# Patient Record
Sex: Male | Born: 1970 | Race: Black or African American | Hispanic: No | Marital: Single | Smoking: Current every day smoker
Health system: Southern US, Community
[De-identification: ages and names within clinical notes are randomized; demographics above are authoritative.]

## PROBLEM LIST (undated history)

## (undated) DIAGNOSIS — N189 Chronic kidney disease, unspecified: Secondary | ICD-10-CM

## (undated) DIAGNOSIS — S21339A Puncture wound without foreign body of unspecified front wall of thorax with penetration into thoracic cavity, initial encounter: Secondary | ICD-10-CM

## (undated) DIAGNOSIS — W3400XA Accidental discharge from unspecified firearms or gun, initial encounter: Secondary | ICD-10-CM

## (undated) HISTORY — PX: ABDOMINAL SURGERY: SHX537

---

## 2002-02-18 ENCOUNTER — Emergency Department (HOSPITAL_COMMUNITY): Admission: EM | Admit: 2002-02-18 | Discharge: 2002-02-18 | Payer: Self-pay | Admitting: Emergency Medicine

## 2002-02-18 ENCOUNTER — Encounter: Payer: Self-pay | Admitting: Emergency Medicine

## 2003-06-01 ENCOUNTER — Emergency Department (HOSPITAL_COMMUNITY): Admission: EM | Admit: 2003-06-01 | Discharge: 2003-06-01 | Payer: Self-pay | Admitting: Nurse Practitioner

## 2003-06-01 ENCOUNTER — Encounter: Payer: Self-pay | Admitting: Emergency Medicine

## 2003-11-26 ENCOUNTER — Emergency Department (HOSPITAL_COMMUNITY): Admission: EM | Admit: 2003-11-26 | Discharge: 2003-11-26 | Payer: Self-pay | Admitting: Emergency Medicine

## 2003-12-14 ENCOUNTER — Emergency Department (HOSPITAL_COMMUNITY): Admission: EM | Admit: 2003-12-14 | Discharge: 2003-12-14 | Payer: Self-pay | Admitting: Emergency Medicine

## 2010-12-17 ENCOUNTER — Inpatient Hospital Stay (INDEPENDENT_AMBULATORY_CARE_PROVIDER_SITE_OTHER)
Admission: RE | Admit: 2010-12-17 | Discharge: 2010-12-17 | Disposition: A | Discharge status: Home / Self Care | Payer: Self-pay | Source: Ambulatory Visit

## 2010-12-17 DIAGNOSIS — R109 Unspecified abdominal pain: Secondary | ICD-10-CM

## 2011-11-21 ENCOUNTER — Encounter (HOSPITAL_BASED_OUTPATIENT_CLINIC_OR_DEPARTMENT_OTHER): Payer: Self-pay | Admitting: *Deleted

## 2011-11-21 ENCOUNTER — Emergency Department (INDEPENDENT_AMBULATORY_CARE_PROVIDER_SITE_OTHER): Payer: Self-pay

## 2011-11-21 ENCOUNTER — Emergency Department (HOSPITAL_BASED_OUTPATIENT_CLINIC_OR_DEPARTMENT_OTHER)
Admission: EM | Admit: 2011-11-21 | Discharge: 2011-11-21 | Disposition: A | Discharge status: Home / Self Care | Payer: Self-pay | Attending: Emergency Medicine | Admitting: Emergency Medicine

## 2011-11-21 DIAGNOSIS — R319 Hematuria, unspecified: Secondary | ICD-10-CM

## 2011-11-21 DIAGNOSIS — N200 Calculus of kidney: Secondary | ICD-10-CM | POA: Insufficient documentation

## 2011-11-21 DIAGNOSIS — R109 Unspecified abdominal pain: Secondary | ICD-10-CM

## 2011-11-21 DIAGNOSIS — L0201 Cutaneous abscess of face: Secondary | ICD-10-CM | POA: Insufficient documentation

## 2011-11-21 DIAGNOSIS — N23 Unspecified renal colic: Secondary | ICD-10-CM

## 2011-11-21 DIAGNOSIS — L03211 Cellulitis of face: Secondary | ICD-10-CM | POA: Insufficient documentation

## 2011-11-21 HISTORY — DX: Accidental discharge from unspecified firearms or gun, initial encounter: W34.00XA

## 2011-11-21 HISTORY — DX: Puncture wound without foreign body of unspecified front wall of thorax with penetration into thoracic cavity, initial encounter: S21.339A

## 2011-11-21 LAB — URINALYSIS, ROUTINE W REFLEX MICROSCOPIC
Nitrite: NEGATIVE
Protein, ur: 30 mg/dL — AB
Specific Gravity, Urine: 1.027 (ref 1.005–1.030)
Urobilinogen, UA: 1 mg/dL (ref 0.0–1.0)

## 2011-11-21 LAB — URINE MICROSCOPIC-ADD ON

## 2011-11-21 LAB — URINE CULTURE

## 2011-11-21 LAB — CULTURE, ROUTINE-ABSCESS

## 2011-11-21 MED ORDER — NAPROXEN 250 MG PO TABS
500.0000 mg | ORAL_TABLET | Freq: Once | ORAL | Status: AC
  Administered 2011-11-21: 500 mg via ORAL
  Filled 2011-11-21: qty 2

## 2011-11-21 MED ORDER — DOXYCYCLINE HYCLATE 100 MG PO TABS
100.0000 mg | ORAL_TABLET | Freq: Once | ORAL | Status: AC
  Administered 2011-11-21: 100 mg via ORAL
  Filled 2011-11-21: qty 1

## 2011-11-21 MED ORDER — LIDOCAINE-EPINEPHRINE 2 %-1:100000 IJ SOLN
INTRAMUSCULAR | Status: AC
  Filled 2011-11-21: qty 1

## 2011-11-21 MED ORDER — NAPROXEN SODIUM 550 MG PO TABS: ORAL_TABLET | ORAL | Status: DC

## 2011-11-21 MED ORDER — HYDROMORPHONE HCL 4 MG PO TABS: ORAL_TABLET | ORAL | Status: DC

## 2011-11-21 MED ORDER — DOXYCYCLINE HYCLATE 100 MG PO CAPS: 100.0000 mg | ORAL_CAPSULE | Freq: Two times a day (BID) | ORAL | Status: AC

## 2011-11-21 MED ORDER — LIDOCAINE-EPINEPHRINE 2 %-1:100000 IJ SOLN: 20.0000 mL | Freq: Once | INTRAMUSCULAR | Status: DC
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
## 2011-11-21 NOTE — ED Notes (Signed)
Pt reports generalized  right sided  abd pain x one year states that pain worsened to the point he was almost unable to stand yesterday. Denies N/V states that he" sure did feel like he was about to pass out yesterday while in the shower" pt also with an abscess to left face.

## 2011-11-23 ENCOUNTER — Emergency Department (HOSPITAL_BASED_OUTPATIENT_CLINIC_OR_DEPARTMENT_OTHER)
Admission: EM | Admit: 2011-11-23 | Discharge: 2011-11-23 | Disposition: A | Discharge status: Home / Self Care | Payer: Self-pay | Attending: Emergency Medicine | Admitting: Emergency Medicine

## 2011-11-23 ENCOUNTER — Encounter (HOSPITAL_BASED_OUTPATIENT_CLINIC_OR_DEPARTMENT_OTHER): Payer: Self-pay | Admitting: *Deleted

## 2011-11-23 DIAGNOSIS — F17200 Nicotine dependence, unspecified, uncomplicated: Secondary | ICD-10-CM | POA: Insufficient documentation

## 2011-11-23 DIAGNOSIS — Z4800 Encounter for change or removal of nonsurgical wound dressing: Secondary | ICD-10-CM | POA: Insufficient documentation

## 2011-11-23 NOTE — ED Notes (Signed)
Pt seen here on Monday had abcess on left lower jaw drained and packed was instructed to return here today for a recheck. Pt states hasnt been having any problems has been taking his meds
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
## 2011-11-24 NOTE — ED Notes (Addendum)
Chart sent to EDP office; I/D done; pt treated w/doxycycline; no sensitivities listed; pt returned on 1/23 for packing removal

## 2011-11-25 NOTE — ED Notes (Signed)
No antibiotic treatment needed at this time. Patient to return for worsening symptoms. Reviewed by Trixie Dredge.

## 2011-11-25 NOTE — ED Notes (Signed)
No further antibiotics treatment needed at this time per  [facility].

## 2011-11-26 LAB — WOUND CULTURE: Culture: NO GROWTH

## 2012-05-11 ENCOUNTER — Other Ambulatory Visit (HOSPITAL_COMMUNITY): Payer: Self-pay | Admitting: Internal Medicine

## 2012-05-11 DIAGNOSIS — R109 Unspecified abdominal pain: Secondary | ICD-10-CM

## 2012-05-15 ENCOUNTER — Ambulatory Visit (HOSPITAL_COMMUNITY)
Admission: RE | Admit: 2012-05-15 | Discharge: 2012-05-15 | Disposition: A | Discharge status: Home / Self Care | Payer: Self-pay | Source: Ambulatory Visit | Attending: Internal Medicine | Admitting: Internal Medicine

## 2012-05-15 DIAGNOSIS — R109 Unspecified abdominal pain: Secondary | ICD-10-CM | POA: Insufficient documentation

## 2012-06-21 ENCOUNTER — Other Ambulatory Visit: Payer: Self-pay | Admitting: Urology

## 2012-06-21 DIAGNOSIS — N200 Calculus of kidney: Secondary | ICD-10-CM

## 2012-07-13 ENCOUNTER — Encounter (HOSPITAL_COMMUNITY): Payer: Self-pay | Admitting: Pharmacy Technician

## 2012-07-16 NOTE — Patient Instructions (Signed)
20 Kery L Cedar  07/16/2012   Your procedure is scheduled on:  07/23/12 1130-200pm  Report to [facility] at 0900 AM.  Call this number if you have problems the morning of surgery: [phone]   Remember:   Do not eat food:After Midnight.  May have clear liquids:until Midnight .    Take these medicines the morning of surgery with A SIP OF WATER:    Do not wear jewelry,   Do not wear lotions, powders, or perfumes.   . Men may shave face and neck.  Do not bring valuables to the hospital.  Contacts, dentures or bridgework may not be worn into surgery.  Leave suitcase in the car. After surgery it may be brought to your room.  For patients admitted to the hospital, checkout time is 11:00 AM the day of discharge.   Special Instructions: CHG Shower Use Special Wash: 1/2 bottle night before surgery and 1/2 bottle morning of surgery. shower chin to toes with CHg.  Wash face and private parts with regular soapl    Please read over the following fact sheets that you were given: MRSA Information, coughing and deep breathing exercises, leg exercises

## 2012-07-17 ENCOUNTER — Encounter (HOSPITAL_COMMUNITY): Payer: Self-pay

## 2012-07-17 ENCOUNTER — Ambulatory Visit (HOSPITAL_COMMUNITY)
Admission: RE | Admit: 2012-07-17 | Discharge: 2012-07-17 | Disposition: A | Discharge status: Home / Self Care | Payer: Self-pay | Source: Ambulatory Visit | Attending: Urology | Admitting: Urology

## 2012-07-17 ENCOUNTER — Encounter (HOSPITAL_COMMUNITY)
Admission: RE | Admit: 2012-07-17 | Discharge: 2012-07-17 | Disposition: A | Discharge status: Home / Self Care | Payer: Self-pay | Source: Ambulatory Visit | Attending: Urology | Admitting: Urology

## 2012-07-17 ENCOUNTER — Other Ambulatory Visit: Payer: Self-pay | Admitting: Radiology

## 2012-07-17 DIAGNOSIS — Z01812 Encounter for preprocedural laboratory examination: Secondary | ICD-10-CM | POA: Insufficient documentation

## 2012-07-17 DIAGNOSIS — N200 Calculus of kidney: Secondary | ICD-10-CM | POA: Insufficient documentation

## 2012-07-17 HISTORY — DX: Chronic kidney disease, unspecified: N18.9

## 2012-07-17 LAB — CBC
Hemoglobin: 14.6 g/dL (ref 13.0–17.0)
MCH: 29.2 pg (ref 26.0–34.0)
MCV: 86.6 fL (ref 78.0–100.0)
Platelets: 364 10*3/uL (ref 150–400)
RBC: 5 MIL/uL (ref 4.22–5.81)
WBC: 6.7 10*3/uL (ref 4.0–10.5)

## 2012-07-17 LAB — BASIC METABOLIC PANEL
CO2: 26 mEq/L (ref 19–32)
Glucose, Bld: 92 mg/dL (ref 70–99)
Potassium: 3.7 mEq/L (ref 3.5–5.1)
Sodium: 137 mEq/L (ref 135–145)

## 2012-07-17 LAB — SURGICAL PCR SCREEN: MRSA, PCR: NEGATIVE

## 2012-07-17 NOTE — Progress Notes (Signed)
Radiology placed orders in computer after preop appointment.  Patient made aware to report to Radiology at 0715am. Patient voiced understanding.

## 2012-07-18 ENCOUNTER — Other Ambulatory Visit: Payer: Self-pay | Admitting: Radiology
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
## 2012-07-23 ENCOUNTER — Inpatient Hospital Stay (HOSPITAL_COMMUNITY)
Admission: RE | Admit: 2012-07-23 | Discharge: 2012-08-09 | DRG: 659 | Disposition: A | Discharge status: Home / Self Care | Payer: MEDICAID | Source: Ambulatory Visit | Attending: Urology | Admitting: Urology

## 2012-07-23 ENCOUNTER — Encounter (HOSPITAL_COMMUNITY): Payer: Self-pay

## 2012-07-23 ENCOUNTER — Ambulatory Visit (HOSPITAL_COMMUNITY)
Admission: RE | Admit: 2012-07-23 | Discharge: 2012-07-23 | Disposition: A | Discharge status: Home / Self Care | Payer: Self-pay | Source: Ambulatory Visit | Attending: Urology | Admitting: Urology

## 2012-07-23 ENCOUNTER — Encounter (HOSPITAL_COMMUNITY): Payer: Self-pay | Admitting: *Deleted

## 2012-07-23 ENCOUNTER — Ambulatory Visit (HOSPITAL_COMMUNITY): Payer: Self-pay | Admitting: Anesthesiology

## 2012-07-23 ENCOUNTER — Inpatient Hospital Stay (HOSPITAL_COMMUNITY)
Admission: RE | Admit: 2012-07-23 | Discharge: 2012-07-23 | Disposition: A | Discharge status: Home / Self Care | Payer: Self-pay | Source: Ambulatory Visit

## 2012-07-23 ENCOUNTER — Encounter (HOSPITAL_COMMUNITY): Payer: Self-pay | Admitting: Anesthesiology

## 2012-07-23 ENCOUNTER — Encounter (HOSPITAL_COMMUNITY)
Admission: RE | Disposition: A | Discharge status: Home / Self Care | Payer: Self-pay | Source: Ambulatory Visit | Attending: Urology

## 2012-07-23 ENCOUNTER — Ambulatory Visit (HOSPITAL_COMMUNITY): Payer: Self-pay

## 2012-07-23 DIAGNOSIS — K560 Paralytic ileus: Secondary | ICD-10-CM | POA: Diagnosis not present

## 2012-07-23 DIAGNOSIS — N200 Calculus of kidney: Secondary | ICD-10-CM

## 2012-07-23 DIAGNOSIS — D62 Acute posthemorrhagic anemia: Secondary | ICD-10-CM | POA: Diagnosis present

## 2012-07-23 DIAGNOSIS — R6520 Severe sepsis without septic shock: Secondary | ICD-10-CM

## 2012-07-23 DIAGNOSIS — R112 Nausea with vomiting, unspecified: Secondary | ICD-10-CM

## 2012-07-23 DIAGNOSIS — F17200 Nicotine dependence, unspecified, uncomplicated: Secondary | ICD-10-CM | POA: Diagnosis present

## 2012-07-23 DIAGNOSIS — R310 Gross hematuria: Secondary | ICD-10-CM | POA: Diagnosis not present

## 2012-07-23 DIAGNOSIS — N390 Urinary tract infection, site not specified: Secondary | ICD-10-CM | POA: Diagnosis present

## 2012-07-23 DIAGNOSIS — N179 Acute kidney failure, unspecified: Secondary | ICD-10-CM | POA: Diagnosis not present

## 2012-07-23 DIAGNOSIS — A419 Sepsis, unspecified organism: Secondary | ICD-10-CM | POA: Diagnosis present

## 2012-07-23 DIAGNOSIS — J189 Pneumonia, unspecified organism: Secondary | ICD-10-CM | POA: Diagnosis not present

## 2012-07-23 DIAGNOSIS — K567 Ileus, unspecified: Secondary | ICD-10-CM

## 2012-07-23 DIAGNOSIS — R338 Other retention of urine: Secondary | ICD-10-CM | POA: Diagnosis not present

## 2012-07-23 DIAGNOSIS — R000 Tachycardia, unspecified: Secondary | ICD-10-CM | POA: Diagnosis present

## 2012-07-23 DIAGNOSIS — J90 Pleural effusion, not elsewhere classified: Secondary | ICD-10-CM | POA: Diagnosis not present

## 2012-07-23 HISTORY — PX: NEPHROLITHOTOMY: SHX5134

## 2012-07-23 LAB — APTT: aPTT: 37 s (ref 24–37)

## 2012-07-23 LAB — PROTIME-INR: Prothrombin Time: 13.3 seconds (ref 11.6–15.2)

## 2012-07-23 SURGERY — NEPHROLITHOTOMY PERCUTANEOUS
Anesthesia: General | Laterality: Right | Wound class: Clean Contaminated

## 2012-07-23 MED ORDER — IOHEXOL 300 MG/ML  SOLN
INTRAMUSCULAR | Status: DC | PRN
  Administered 2012-07-23: 25 mL

## 2012-07-23 MED ORDER — KCL IN DEXTROSE-NACL 20-5-0.45 MEQ/L-%-% IV SOLN
INTRAVENOUS | Status: DC
  Administered 2012-07-23: 100 mL/h via INTRAVENOUS
  Administered 2012-07-24 – 2012-07-25 (×3): via INTRAVENOUS
  Administered 2012-07-25: 150 mL/h via INTRAVENOUS
  Filled 2012-07-23 (×5): qty 1000

## 2012-07-23 MED ORDER — CIPROFLOXACIN IN D5W 400 MG/200ML IV SOLN
400.0000 mg | Freq: Two times a day (BID) | INTRAVENOUS | Status: DC
  Administered 2012-07-23 – 2012-07-28 (×11): 400 mg via INTRAVENOUS
  Filled 2012-07-23 (×15): qty 200

## 2012-07-23 MED ORDER — IOHEXOL 300 MG/ML  SOLN
INTRAMUSCULAR | Status: AC
  Filled 2012-07-23: qty 2

## 2012-07-23 MED ORDER — LIDOCAINE HCL (CARDIAC) 20 MG/ML IV SOLN
INTRAVENOUS | Status: DC | PRN
  Administered 2012-07-23: 75 mg via INTRAVENOUS

## 2012-07-23 MED ORDER — NEOSTIGMINE METHYLSULFATE 1 MG/ML IJ SOLN
INTRAMUSCULAR | Status: DC | PRN
  Administered 2012-07-23: 4 mg via INTRAVENOUS

## 2012-07-23 MED ORDER — KCL IN DEXTROSE-NACL 20-5-0.45 MEQ/L-%-% IV SOLN
INTRAVENOUS | Status: AC
  Filled 2012-07-23: qty 1000

## 2012-07-23 MED ORDER — IOHEXOL 300 MG/ML  SOLN
50.0000 mL | Freq: Once | INTRAMUSCULAR | Status: AC | PRN
  Administered 2012-07-23: 50 mL

## 2012-07-23 MED ORDER — ONDANSETRON HCL 4 MG/2ML IJ SOLN
INTRAMUSCULAR | Status: DC | PRN
  Administered 2012-07-23: 4 mg via INTRAVENOUS

## 2012-07-23 MED ORDER — PROPOFOL 10 MG/ML IV BOLUS
INTRAVENOUS | Status: DC | PRN
  Administered 2012-07-23: 200 mg via INTRAVENOUS

## 2012-07-23 MED ORDER — MIDAZOLAM HCL 5 MG/5ML IJ SOLN
INTRAMUSCULAR | Status: AC | PRN
  Administered 2012-07-23: 1 mg via INTRAVENOUS
  Administered 2012-07-23: 2 mg via INTRAVENOUS

## 2012-07-23 MED ORDER — ONDANSETRON HCL 4 MG/2ML IJ SOLN
4.0000 mg | INTRAMUSCULAR | Status: DC | PRN
  Administered 2012-07-24 – 2012-07-25 (×4): 4 mg via INTRAVENOUS
  Filled 2012-07-23 (×4): qty 2

## 2012-07-23 MED ORDER — HYDROCODONE-ACETAMINOPHEN 5-325 MG PO TABS
1.0000 | ORAL_TABLET | ORAL | Status: DC | PRN
  Administered 2012-07-23 – 2012-08-09 (×43): 2 via ORAL
  Filled 2012-07-23 (×27): qty 2
  Filled 2012-07-23: qty 1
  Filled 2012-07-23 (×18): qty 2

## 2012-07-23 MED ORDER — LACTATED RINGERS IV SOLN
INTRAVENOUS | Status: DC | PRN
  Administered 2012-07-23 (×2): via INTRAVENOUS

## 2012-07-23 MED ORDER — HYDROMORPHONE HCL PF 1 MG/ML IJ SOLN: 0.2500 mg | INTRAMUSCULAR | Status: DC | PRN

## 2012-07-23 MED ORDER — PNEUMOCOCCAL VAC POLYVALENT 25 MCG/0.5ML IJ INJ
0.5000 mL | INJECTION | INTRAMUSCULAR | Status: AC
  Administered 2012-07-24: 0.5 mL via INTRAMUSCULAR
  Filled 2012-07-23: qty 0.5

## 2012-07-23 MED ORDER — PROMETHAZINE HCL 25 MG/ML IJ SOLN: 6.2500 mg | INTRAMUSCULAR | Status: DC | PRN

## 2012-07-23 MED ORDER — INFLUENZA VIRUS VACC SPLIT PF IM SUSP
0.5000 mL | INTRAMUSCULAR | Status: AC
  Administered 2012-07-24: 0.5 mL via INTRAMUSCULAR
  Filled 2012-07-23: qty 0.5

## 2012-07-23 MED ORDER — ROCURONIUM BROMIDE 100 MG/10ML IV SOLN
INTRAVENOUS | Status: DC | PRN
  Administered 2012-07-23: 40 mg via INTRAVENOUS

## 2012-07-23 MED ORDER — HYDROMORPHONE HCL PF 1 MG/ML IJ SOLN
INTRAMUSCULAR | Status: DC | PRN
  Administered 2012-07-23 (×4): 0.5 mg via INTRAVENOUS

## 2012-07-23 MED ORDER — FENTANYL CITRATE 0.05 MG/ML IJ SOLN
INTRAMUSCULAR | Status: AC
  Filled 2012-07-23: qty 6

## 2012-07-23 MED ORDER — FENTANYL CITRATE 0.05 MG/ML IJ SOLN
INTRAMUSCULAR | Status: DC | PRN
  Administered 2012-07-23: 50 ug via INTRAVENOUS
  Administered 2012-07-23 (×2): 100 ug via INTRAVENOUS

## 2012-07-23 MED ORDER — MIDAZOLAM HCL 2 MG/2ML IJ SOLN
INTRAMUSCULAR | Status: AC
  Filled 2012-07-23: qty 6

## 2012-07-23 MED ORDER — CIPROFLOXACIN IN D5W 400 MG/200ML IV SOLN
400.0000 mg | INTRAVENOUS | Status: AC
  Administered 2012-07-23: 400 mg via INTRAVENOUS
  Filled 2012-07-23: qty 200

## 2012-07-23 MED ORDER — FENTANYL CITRATE 0.05 MG/ML IJ SOLN
INTRAMUSCULAR | Status: AC | PRN
  Administered 2012-07-23: 50 ug via INTRAVENOUS
  Administered 2012-07-23 (×2): 100 ug via INTRAVENOUS

## 2012-07-23 MED ORDER — LACTATED RINGERS IV SOLN: INTRAVENOUS | Status: DC

## 2012-07-23 MED ORDER — GLYCOPYRROLATE 0.2 MG/ML IJ SOLN
INTRAMUSCULAR | Status: DC | PRN
  Administered 2012-07-23: 0.6 mg via INTRAVENOUS

## 2012-07-23 MED ORDER — ESMOLOL HCL 10 MG/ML IV SOLN
INTRAVENOUS | Status: DC | PRN
  Administered 2012-07-23 (×3): 20 mg via INTRAVENOUS

## 2012-07-23 MED ORDER — CIPROFLOXACIN IN D5W 400 MG/200ML IV SOLN: 400.0000 mg | INTRAVENOUS | Status: DC

## 2012-07-23 MED ORDER — HYDROMORPHONE HCL PF 1 MG/ML IJ SOLN
0.5000 mg | INTRAMUSCULAR | Status: DC | PRN
  Administered 2012-07-23 – 2012-07-28 (×27): 1 mg via INTRAVENOUS
  Administered 2012-07-28: 0.5 mg via INTRAVENOUS
  Administered 2012-07-28 – 2012-08-06 (×55): 1 mg via INTRAVENOUS
  Filled 2012-07-23 (×85): qty 1

## 2012-07-23 MED ORDER — SODIUM CHLORIDE 0.9 % IR SOLN
Status: DC | PRN
  Administered 2012-07-23: 9000 mL

## 2012-07-23 MED ORDER — SODIUM CHLORIDE 0.9 % IV SOLN
INTRAVENOUS | Status: DC
  Administered 2012-07-23 (×2): via INTRAVENOUS
  Administered 2012-07-24: 10 mL/h via INTRAVENOUS

## 2012-07-23 SURGICAL SUPPLY — 44 items
BAG URINE DRAINAGE (UROLOGICAL SUPPLIES) ×2 IMPLANT
BASKET ZERO TIP NITINOL 2.4FR (BASKET) IMPLANT
BENZOIN TINCTURE PRP APPL 2/3 (GAUZE/BANDAGES/DRESSINGS) ×4 IMPLANT
CATH FOLEY 2W COUNCIL 20FR 5CC (CATHETERS) IMPLANT
CATH FOLEY 2W COUNCIL 5CC 18FR (CATHETERS) ×2 IMPLANT
CATH FOLEY 2WAY SLVR  5CC 18FR (CATHETERS) ×1
CATH FOLEY 2WAY SLVR 5CC 18FR (CATHETERS) ×1 IMPLANT
CATH ROBINSON RED A/P 20FR (CATHETERS) IMPLANT
CATH X-FORCE N30 NEPHROSTOMY (TUBING) ×2 IMPLANT
CLOTH BEACON ORANGE TIMEOUT ST (SAFETY) ×2 IMPLANT
COVER SURGICAL LIGHT HANDLE (MISCELLANEOUS) ×2 IMPLANT
DRAPE C-ARM 42X72 X-RAY (DRAPES) ×2 IMPLANT
DRAPE CAMERA CLOSED 9X96 (DRAPES) ×2 IMPLANT
DRAPE LINGEMAN PERC (DRAPES) ×2 IMPLANT
DRAPE SURG IRRIG POUCH 19X23 (DRAPES) ×2 IMPLANT
DRSG TEGADERM 8X12 (GAUZE/BANDAGES/DRESSINGS) ×4 IMPLANT
GLOVE BIOGEL M STRL SZ7.5 (GLOVE) ×2 IMPLANT
GOWN STRL REIN XL XLG (GOWN DISPOSABLE) ×4 IMPLANT
GUIDEWIRE ANG ZIPWIRE 038X150 (WIRE) ×2 IMPLANT
KIT BASIN OR (CUSTOM PROCEDURE TRAY) ×2 IMPLANT
LASER FIBER DISP (UROLOGICAL SUPPLIES) IMPLANT
LASER FIBER DISP 1000U (UROLOGICAL SUPPLIES) IMPLANT
MANIFOLD NEPTUNE II (INSTRUMENTS) ×2 IMPLANT
NS IRRIG 1000ML POUR BTL (IV SOLUTION) IMPLANT
PACK BASIC VI WITH GOWN DISP (CUSTOM PROCEDURE TRAY) ×2 IMPLANT
PAD ABD 7.5X8 STRL (GAUZE/BANDAGES/DRESSINGS) ×4 IMPLANT
POSITIONER SURGICAL ARM (MISCELLANEOUS) ×4 IMPLANT
PROBE EHL 9 FR 470CM (MISCELLANEOUS) IMPLANT
PROBE LITHOCLAST ULTRA 3.8X403 (UROLOGICAL SUPPLIES) ×2 IMPLANT
PROBE PNEUMATIC 1.0MMX570MM (UROLOGICAL SUPPLIES) ×2 IMPLANT
SET IRRIG Y TYPE TUR BLADDER L (SET/KITS/TRAYS/PACK) ×2 IMPLANT
SET WARMING FLUID IRRIGATION (MISCELLANEOUS) ×2 IMPLANT
SPONGE GAUZE 4X4 12PLY (GAUZE/BANDAGES/DRESSINGS) ×2 IMPLANT
SPONGE LAP 4X18 X RAY DECT (DISPOSABLE) ×2 IMPLANT
STONE CATCHER W/TUBE ADAPTER (UROLOGICAL SUPPLIES) ×2 IMPLANT
SUT SILK 2 0 30  PSL (SUTURE) ×1
SUT SILK 2 0 30 PSL (SUTURE) ×1 IMPLANT
SYR 20CC LL (SYRINGE) ×4 IMPLANT
SYRINGE 10CC LL (SYRINGE) ×2 IMPLANT
TOWEL OR 17X26 10 PK STRL BLUE (TOWEL DISPOSABLE) ×2 IMPLANT
TOWEL OR NON WOVEN STRL DISP B (DISPOSABLE) ×2 IMPLANT
TRAY FOLEY CATH 14FRSI W/METER (CATHETERS) ×2 IMPLANT
TUBING CONNECTING 10 (TUBING) ×6 IMPLANT
WATER STERILE IRR 1500ML POUR (IV SOLUTION) IMPLANT

## 2012-07-23 NOTE — Interval H&P Note (Signed)
History and Physical Interval Note:  07/23/2012 11:25 AM  Jared Mendez  has presented today for surgery, with the diagnosis of Right Renal Calculus  The various methods of treatment have been discussed with the patient and family. After consideration of risks, benefits and other options for treatment, the patient has consented to  Procedure(s) (LRB) with comments: NEPHROLITHOTOMY PERCUTANEOUS (Right) -   as a surgical intervention .  The patient's history has been reviewed, patient examined, no change in status, stable for surgery.  I have reviewed the patient's chart and labs.  Questions were answered to the patient's satisfaction.     [name]

## 2012-07-23 NOTE — Transfer of Care (Signed)
Immediate Anesthesia Transfer of Care Note  Patient: Jared Mendez  Procedure(s) Performed: Procedure(s) (LRB) with comments: NEPHROLITHOTOMY PERCUTANEOUS (Right) -    Patient Location: PACU  Anesthesia Type: General  Level of Consciousness: awake, alert  and patient cooperative  Airway & Oxygen Therapy: Patient Spontanous Breathing and Patient connected to face mask oxygen  Post-op Assessment: Report given to PACU RN, Post -op Vital signs reviewed and stable and Patient moving all extremities X 4  Post vital signs: Reviewed and stable  Complications: No apparent anesthesia complications

## 2012-07-23 NOTE — Anesthesia Postprocedure Evaluation (Signed)
Anesthesia Post Note  Patient: Jared Mendez  Procedure(s) Performed: Procedure(s) (LRB): NEPHROLITHOTOMY PERCUTANEOUS (Right)  Anesthesia type: General  Patient location: PACU  Post pain: Pain level controlled  Post assessment: Post-op Vital signs reviewed  Last Vitals:  Filed Vitals:   07/23/12 1414  Temp: 36 C  Resp: 12    Post vital signs: Reviewed  Level of consciousness: sedated  Complications: No apparent anesthesia complications

## 2012-07-23 NOTE — Procedures (Signed)
Successful placement of right sided 5 Fr catheter for PNL access.  Tip of catheter is within the urinary bladder.  No immediate complications.    

## 2012-07-23 NOTE — Anesthesia Preprocedure Evaluation (Addendum)
Anesthesia Evaluation  Patient identified by MRN, date of birth, ID band Patient awake    Reviewed: Allergy & Precautions, H&P , NPO status , Patient's Chart, lab work & pertinent test results  Airway Mallampati: II TM Distance: >3 FB Neck ROM: Full    Dental  (+) Teeth Intact and Dental Advisory Given   Pulmonary neg pulmonary ROS, Current Smoker,  breath sounds clear to auscultation  Pulmonary exam normal       Cardiovascular negative cardio ROS  Rhythm:Regular Rate:Normal     Neuro/Psych negative neurological ROS  negative psych ROS   GI/Hepatic negative GI ROS, Neg liver ROS,   Endo/Other  negative endocrine ROS  Renal/GU Renal diseasenegative Renal ROS  negative genitourinary   Musculoskeletal negative musculoskeletal ROS (+)   Abdominal   Peds  Hematology negative hematology ROS (+)   Anesthesia Other Findings   Reproductive/Obstetrics negative OB ROS                           Anesthesia Physical Anesthesia Plan  ASA: II  Anesthesia Plan: General   Post-op Pain Management:    Induction: Intravenous  Airway Management Planned: LMA  Additional Equipment:   Intra-op Plan:   Post-operative Plan: Extubation in OR  Informed Consent: I have reviewed the patients History and Physical, chart, labs and discussed the procedure including the risks, benefits and alternatives for the proposed anesthesia with the patient or authorized representative who has indicated his/her understanding and acceptance.   Dental advisory given  Plan Discussed with: CRNA  Anesthesia Plan Comments:         Anesthesia Quick Evaluation
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
## 2012-07-23 NOTE — Op Note (Signed)
Preoperative diagnosis: Right renal calculus Postoperative diagnosis: Same  Procedure: Right percutaneous nephrolithotomy   Surgeon: Valetta Fuller M.D. assisted by Dr. Megan Mans in interventional radiology. Anesthesia: Gen.  Indications: Patient was sent to [place] from the [facility] community network for consideration of treatment of a right renal calculus. He had been complaining of intermittent/chronic right-sided abdominal pain and was noted to have a 1.3 cm stone in his right renal pelvis/hilum. There was minimal hydronephrosis but the thought was that this might be causing his discomfort. We agreed to care for him through the community care network. We talked about several treatment options. I felt the stone was really quite dense and that the best chance for definitive management of the stone was a percutaneous procedure. 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 We did offer to go having care for him if he shows and he wanted [place] to proceed with definitive treatment.     Technique and findings: The patient had placement of a right percutaneous nephrostomy tube by interventional radiology prior to the procedure. At access was quite difficult but successful and I did review the procedure and films with the interventional radiologist. The patient had placement of compression boots and preoperative antibiotics. He had successful induction of general endotracheal anesthesia and placement of Foley catheter. He was then placed in the supine position and prepped and draped in usual manner. The kidney was accessed and a safety wire placed in the bladder. Once 2 wires were in good position of the fashion was dilated with a balloon and access sheath was placed. With the help was fluoroscopy we were able to access the stone which again appeared to be between 1-1/2 cm. The ultrasonic probe was utilized to break up the stone and sucked out majority of  the fragments. 5 or 6 pieces were then basket extracted. No obvious residual fragments were noted. A nephrostomy tube was placed utilizing an 18 [place] council tip Foley catheter with contrast injected. This is secured to the skin. The patient was brought to recovery room in stable condition having had no obvious complications or problems.

## 2012-07-24 ENCOUNTER — Inpatient Hospital Stay (HOSPITAL_COMMUNITY): Payer: Self-pay

## 2012-07-24 ENCOUNTER — Encounter (HOSPITAL_COMMUNITY): Payer: Self-pay | Admitting: Urology

## 2012-07-24 LAB — HEMOGLOBIN AND HEMATOCRIT, BLOOD
HCT: 28.9 % — ABNORMAL LOW (ref 39.0–52.0)
Hemoglobin: 10.1 g/dL — ABNORMAL LOW (ref 13.0–17.0)

## 2012-07-24 LAB — CBC
HCT: 35.5 % — ABNORMAL LOW (ref 39.0–52.0)
MCV: 86.6 fL (ref 78.0–100.0)
RBC: 4.1 MIL/uL — ABNORMAL LOW (ref 4.22–5.81)
WBC: 16.7 10*3/uL — ABNORMAL HIGH (ref 4.0–10.5)

## 2012-07-24 LAB — BASIC METABOLIC PANEL
BUN: 13 mg/dL (ref 6–23)
CO2: 22 mEq/L (ref 19–32)
Chloride: 98 mEq/L (ref 96–112)
Creatinine, Ser: 1.39 mg/dL — ABNORMAL HIGH (ref 0.50–1.35)
Potassium: 3.9 mEq/L (ref 3.5–5.1)

## 2012-07-24 MED ORDER — KETOROLAC TROMETHAMINE 30 MG/ML IJ SOLN
30.0000 mg | Freq: Once | INTRAMUSCULAR | Status: AC
  Administered 2012-07-24: 30 mg via INTRAVENOUS
  Filled 2012-07-24: qty 1

## 2012-07-24 MED ORDER — SODIUM CHLORIDE 0.9 % IV BOLUS (SEPSIS)
1000.0000 mL | Freq: Once | INTRAVENOUS | Status: AC
  Administered 2012-07-24: 1000 mL via INTRAVENOUS

## 2012-07-24 MED ORDER — PANTOPRAZOLE SODIUM 40 MG PO TBEC
40.0000 mg | DELAYED_RELEASE_TABLET | Freq: Every day | ORAL | Status: DC
  Administered 2012-07-24: 40 mg via ORAL
  Filled 2012-07-24 (×3): qty 1

## 2012-07-24 MED ORDER — PROMETHAZINE HCL 25 MG/ML IJ SOLN
25.0000 mg | Freq: Once | INTRAMUSCULAR | Status: AC
  Administered 2012-07-24: 25 mg via INTRAVENOUS
  Filled 2012-07-24: qty 1

## 2012-07-24 NOTE — Progress Notes (Signed)
Chaplain responded to code blue.  Provided emotional support with pt's grandmother during code.    Belva Crome  MDiv

## 2012-07-24 NOTE — Progress Notes (Signed)
Patient continue to C/O N/V, PRN Zofran given, not as effective. Vomiting coffee grand color emesis, @ 7Am before eating breakfast he vomited as well. Dr Isabel Caprice notified, ordered Toradol 30mg  and phenergan 25mg  X1, and also protonix. Patient informed, will continue to assess patient.

## 2012-07-24 NOTE — Progress Notes (Signed)
Noted large amount of blood and clot on patient's bed from the nephrostomy site that was removed at 1255 by Dr. Isabel Caprice, assessed site but no more active bleeding from site, Pressure dressing applied to the site again, patient stated he wants to go to the bathroom to urinate, encouraged patient at least to use the urinal standing at the bedside with assistance because of the blood loss, he stated no he can walk and wants to go the bathroom to urinate. Assisted patient to the bathroom trying to change his gown, patient stated he is dizzy leaning forward, supported the patient with my hands, was unresponsive for about a second and back responding, called for assistance, code blue and assisted patient back in bed, BP106/70, 104, 16, 99%-2Ls N/C, CBG-240, patient responding appropriately and answer question. Dr. Isabel Caprice notified and ordered 1L of NS bolus, H/H. Patient'd grandmother at the bedside and updated with information. rechecked vitals 112/74, 100%-2Ls. In bed resting C/O nausea, Zofran given. Will continue to assess patient.

## 2012-07-24 NOTE — Progress Notes (Signed)
Patient in bed resting and no distress noted, no bleeding noted,  dressing clean/dry/intact. Reported off to night shift nurse.

## 2012-07-24 NOTE — Progress Notes (Signed)
Patient ID: Jared Mendez, male   DOB: 1971-06-18, 41 y.o.   MRN: [id number] 1 Day Post-Op Subjective: Patient moderate pain and nausea. last 24 hours: Temp:  [96.8 F (36 C)-98.5 F (36.9 C)] 98.2 F (36.8 C) (09/24 1051) Pulse Rate:  [85-104] 95  (09/24 1051) Resp:  [12-16] 16  (09/24 1051) BP: (121-147)/(75-97) 136/97 mmHg (09/24 1051) SpO2:  [96 %-100 %] 99 % (09/24 1051) Weight:  [76.204 kg (168 lb)] 76.204 kg (168 lb) (09/23 1512)  Intake/Output from previous day: 09/23 0701 - 09/24 0700 In: 1100 [I.V.:1100] Out: 565 [Urine:555] Intake/Output this shift: Total I/O In: -  Out: 2 [Emesis/NG output:1; Blood:1]  Physical Exam:  Constitutional: Vital signs reviewed. WD WN in NAD   Eyes: PERRL, No scleral icterus.   Cardiovascular: RRR Pulmonary/Chest: Normal effort Abdominal: Soft. Non-tender. NT site ok.  Genitourinary:foley draining well Extremities: No cyanosis or edema   Lab Results:  [facility] 07/24/12 0442  HGB 12.1*  HCT 35.5*   BMET  Basename 07/24/12 0442  NA 131*  K 3.9  CL 98  CO2 22  GLUCOSE 111*  BUN 13  CREATININE 1.39*  CALCIUM 8.4    Basename 07/23/12 0800  LABPT --  INR 1.02   No results found for this basename: LABURIN:1 in the last 72 hours Results for orders placed during the hospital encounter of 07/17/12  SURGICAL PCR SCREEN     Status: Normal   Collection Time   07/17/12  9:45 AM      Component Value Range Status Comment   MRSA, PCR NEGATIVE  NEGATIVE Final    Staphylococcus aureus NEGATIVE  NEGATIVE Final     Studies/Results: Ct Abdomen Pelvis Wo Contrast  07/24/2012  *RADIOLOGY REPORT*  Clinical Data: Right flank pain  CT ABDOMEN AND PELVIS WITHOUT CONTRAST  Technique:  Multidetector CT imaging of the abdomen and pelvis was performed following the standard protocol without intravenous contrast.  Comparison:  07/23/2012 percutaneous nephrostomy, 11/21/2011 CT.  Findings: Small right pleural effusion.  Trace left pleural  effusion.  Associated consolidations.  Moderate hiatal hernia. Normal heart size.  Organ abnormality/lesion detection is limited in the absence of intravenous contrast. Within this limitation, unremarkable liver, spleen, pancreas, left adrenal gland.  Right adrenal gland is difficult to localize.  High attenuation within the gallbladder presumably represents vicarious excretion.  No biliary ductal dilatation.  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  Surgical suture along the stomach and left upper quadrant.  The right kidney is rotated and heterogeneous in attenuation. Percutaneous nephrostomy tube in place.  The tube balloon is positioned within the lower pole cortex.  There is high attenuation within the renal pelvis as well as locules of gas which extend inferiorly along the course of the right ureter. No residual stone fragments identified.  There is a 3.1 x 7.1 cm hematoma within the right posterior pararenal space.  Small amount of blood products extends inferiorly within the right paracolic gutter and anterior to the psoas.  There is a simple appearing perihepatic fluid.  No bowel obstruction.  No CT evidence for colitis.  No free intraperitoneal air.  No lymphadenopathy.  Limited vascular evaluation without intravenous contrast.  No aneurysmal dilatation.  Decompressed bladder with a Foley catheter balloon in place.  No pelvic lymphadenopathy.  L1 vertebral body deformity is post traumatic.  No acute osseous finding.  IMPRESSION: Status post right percutaneous nephrostomy.  The nephrostomy tube tip is within the collecting system however the  balloon appears to be within the cortex.  The right renal parenchyma is heterogeneous in attenuation which may reflect areas of retained contrast. Attention at follow-up.  Hematoma within the right posterior pararenal space as above.  High attenuation within the right collecting system is in keeping with retained contrast or blood  products. No residual stone fragments identified.  Small right pleural effusion with associated consolidation, likely atelectasis.  Discussed via telephone with Dr. Grace Isaac at 08:30 a.m. on 07/24/2012.   Original Report Authenticated By: Waneta Martins, M.D.    Dg Abd 1 View  07/23/2012  *RADIOLOGY REPORT*  Clinical Data:  RIGHT renal calculi.  Status post nephrostomy access.  Dilatation of the nephrostomy tract is performed intraoperatively for nephrolithotomy.  DILATATION OF RIGHT PERCUTANEOUS NEPHROSTOMY TRACT  The nephrostomy catheter was removed over a guidewire in the operating room.  C-arm fluoroscopy was utilized.  A 9-French peel- away sheath was advanced into the ureter.  A second guidewire was then advanced into the bladder.  Percutaneous nephrostomy tract dilatation was performed with a 10 mm balloon.  A 30-French sheath was then advanced to the level of the renal calculus under C-arm fluoroscopy.  This sheath was utilized by Dr. Isabel Caprice during the nephrolithotomy procedure.  IMPRESSION: Intraoperative dilatation of right percutaneous nephrostomy tract for nephrolithotomy.   Original Report Authenticated By: Waynard Reeds, M.D.    Dg C-arm 1-60 Min-no Report  07/23/2012  *RADIOLOGY REPORT*  Clinical Data:  RIGHT renal calculi.  Status post nephrostomy access.  Dilatation of the nephrostomy tract is performed intraoperatively for nephrolithotomy.  DILATATION OF RIGHT PERCUTANEOUS NEPHROSTOMY TRACT  The nephrostomy catheter was removed over a guidewire in the operating room.  C-arm fluoroscopy was utilized.  A 9-French peel- away sheath was advanced into the ureter.  A second guidewire was then advanced into the bladder.  Percutaneous nephrostomy tract dilatation was performed with a 10 mm balloon.  A 30-French sheath was then advanced to the level of the renal calculus under C-arm fluoroscopy.  This sheath was utilized by Dr. Isabel Caprice during the nephrolithotomy procedure.  IMPRESSION:  Intraoperative dilatation of right percutaneous nephrostomy tract for nephrolithotomy.   Original Report Authenticated By: Waynard Reeds, M.D.    [place] Abts Cath Perc Right  07/23/2012  *RADIOLOGY REPORT*  Indication: Renal stones, access for right-sided percutaneous nephrolithotomy.  1.  FLUOROSCOPIC GUIDANCE FOR PUNCTURE OF THE RIGHT RENAL COLLECTING SYSTEM. 2. RIGHT SIDED PERCUTANEOUS NEPHROSTOMY TUBE PLACEMENT  Comparison: CT of the abdomen and pelvis - 11/21/2011; abdominal radiograph - 05/15/2012  Intravenous medications: Fentanyl 300 mcg IV; Versed 3 mg IV; Ciprofloxacin 400 mg IV; The antibiotic was administered in an appropriate time frame prior to skin puncture.  Total Moderate Sedation Time: 45 minutes.  Contrast: 50 mL Omnipaque-300 (administered into the collecting system)  Fluoroscopy Time: 19.7 minutes.  Complications: None immediate  Procedure:  Informed written consent was obtained from the patient after a discussion of the risks, benefits, and alternatives to treatment. The right flank region was prepped with Betadine in a sterile fashion, and a sterile drape was applied covering the operative field.  A sterile gown and sterile gloves were used for the procedure.  A timeout was performed prior to the initiation of the procedure.  A pre procedural spot fluoroscopic image was obtained of the upper abdomen.  Ultrasound scanning performed of the kidney was negative for significant hydronephrosis.  As such, the stone within inferior aspect of the right kidney was targeted fluoroscopically  with a 22 gauge Chiba needle.  Access to the collecting system was confirmed with advancement of a Nitrex wire into the collecting system.  The needle was exchanged for the inner 3 French catheter from an Accustick set and contrast injection confirmed access.   A small amount of air was injected into the collecting system to help delineate a posterior calyx.  A posterior inferior calyx was targeted with a 22  gauge Chiba needle.  Access to the calyx was confirmed with advancement of a Nitrex wire into the collecting system.  An Accustick set was utilized to dilate the tract and was subsequently exchanged for a Kumpe catheter over a Bentson wire. The angled Rim catheter was advanced down the ureter and into the urinary bladder.  Postprocedural spot radiographs were obtained in various obliquities and the catheter was sutured to the skin.  The catheter was capped and a dressing was placed.  The patient tolerated the procedure well without immediate postprocedural complication.  Findings:  Pre procedural spot radiographic images demonstrates grossly unchanged appearance of these approximately 1.0 x 1.1 cm stone overlying the expected location of the inferior pole of the right kidney as demonstrated on recent abdominal CT.  Ultrasound scanning was negative for significant hydronephrosis and as such, the stone was targeted fluoroscopically allowing access to the collecting system.  Post contrast injection of the collecting system demonstrates lateral rotation of the right kidney with the renal pelvis and UPJ positioned laterally as demonstrated on preprocedural abdominal CT. The stone appears in an indeterminate location, possibly within the inferior aspect of a nondilated renal pelvis versus the infundibulum supplying the posterior renal calyces.  Given the malpositioning of the right kidney, a slightly medial approach was utilized to access a now opacified posterior inferior calix via fluoroscopic guidance.  Ultimately a Rim angled catheter was advanced through this calix with tip ultimately terminating within the urinary bladder.  IMPRESSION:  1.  Successful fluoroscopic guided placement of a right sided 5 French angled Rim catheter to the level of the urinary bladder to be utilized during impending nephrolithotomy procedure.  2.  Malpositioned right kidney with renal pelvis directed laterally.  The existing renal stone is  in an indeterminate location, either within the inferior aspect of a nondilated renal pelvis or the infundibulum supplying the inferior calyces.  Above findings discussed and reviewed with Dr. Isabel Caprice at the time of procedure completion.   Original Report Authenticated By: Waynard Reeds, M.D.     Assessment/Plan:   CT imaging shows resolution of the stone and it does appear we obtained all the stone material. He does have a hematoma around his kidney but nothing that is not unexpected. Hemoglobin was reasonable.  I went ahead I discontinued the patient's nephrostomy tube in his Foley will be removed. We'll monitor his hemoglobin and continue to observe him overnight. The patient did receive one dose of Toradol which helped his pain but I want to be careful with that since he does have a hematoma.   LOS: 1 day   Sherrice Creekmore S 07/24/2012, 12:56 PM

## 2012-07-24 NOTE — Care Management Note (Signed)
    Page 1 of 2   08/09/2012     2:24:44 PM   CARE MANAGEMENT NOTE 08/09/2012  Patient:  Jared Mendez, Jared Mendez   Account Number:  [id number]  Date Initiated:  07/24/2012  Documentation initiated by:  Lanier Clam  Subjective/Objective Assessment:   ADMITTED W/RENAL STONE.NEPHROSTOMY.     Action/Plan:   FROM HOME   Anticipated DC Date:  08/09/2012   Anticipated DC Plan:  HOME/SELF CARE  In-house referral  Artist  PCP / [facility]      DC Planning Services  CM consult  Medication Assistance      Choice offered to / List presented to:             Status of service:  Completed, signed off Medicare Important Message given?   (If response is "NO", the following Medicare IM given date fields will be blank) Date Medicare IM given:   Date Additional Medicare IM given:    Discharge Disposition:  HOME/SELF CARE  Per UR Regulation:  Reviewed for med. necessity/level of care/duration of stay  If discussed at Long Length of Stay Meetings, dates discussed:   07/31/2012  08/02/2012  08/07/2012  08/09/2012    Comments:  08/09/12 Roosvelt Churchwell RN,BSN NCM [phone] D/C HOME NO D/C NEEDS.  08/07/12 Abhishek Levesque RN,BSN NCM [phone] 2UPRBC.CONTINUE TO MONITOR PROGRESS.D/C PLAN HOME.IF HHC NEEDED CAN ARRANGE. RECOMMEND HHRN.  08/02/12 Ramiyah Mcclenahan RN,BSN NCM [phone] STILL W/HEMATURIA.IR FOLLOWING FOR ?STENT/EMBOLIZATION.   07/31/12 Kevona Lupinacci RN,BSN NCM [phone] S/P PERCUTANEOUS R KIDNEY REMOVAL,PULM FOLLOWING-PLEURAL EFFUSION,THORACENTESIS.HGB 7.WBC-ELEVATED.HOME WHEN STABLE.  [id number] Earlene Plater, RN, BSN, CCM: CHART REVIEWED AND UPDATED. NO DISCHARGE NEEDS PRESENT AT THIS TIME. CASE MANAGEMENT [phone]   07/25/12 Ludella Pranger RN,BSN NCM [phone] TRANSFERRED TO SDU-TACHY,TEMP,VOMITING DARK BROWN EMESIS.RAPID RESPONSE YESTERDAY-UNRESPONSIVE,BLOOD @ NEPHROSTOMY SITE. QUALIFIES FOR INDIGENT FUNDS IF NEEDED.PROVIDED W/PCP LISTING/[facility]/COMMUNITY  RESOURCES.  07/24/12 Chiquita Heckert RN,BSN NCM [phone]

## 2012-07-25 ENCOUNTER — Inpatient Hospital Stay (HOSPITAL_COMMUNITY): Payer: MEDICAID

## 2012-07-25 DIAGNOSIS — R000 Tachycardia, unspecified: Secondary | ICD-10-CM

## 2012-07-25 DIAGNOSIS — D62 Acute posthemorrhagic anemia: Secondary | ICD-10-CM

## 2012-07-25 DIAGNOSIS — N390 Urinary tract infection, site not specified: Secondary | ICD-10-CM

## 2012-07-25 DIAGNOSIS — A419 Sepsis, unspecified organism: Secondary | ICD-10-CM | POA: Diagnosis present

## 2012-07-25 DIAGNOSIS — R6520 Severe sepsis without septic shock: Secondary | ICD-10-CM

## 2012-07-25 DIAGNOSIS — R112 Nausea with vomiting, unspecified: Secondary | ICD-10-CM

## 2012-07-25 LAB — HEMOGLOBIN AND HEMATOCRIT, BLOOD
HCT: 20 % — ABNORMAL LOW (ref 39.0–52.0)
Hemoglobin: 7.2 g/dL — ABNORMAL LOW (ref 13.0–17.0)

## 2012-07-25 LAB — CBC
HCT: 25.9 % — ABNORMAL LOW (ref 39.0–52.0)
Hemoglobin: 8.9 g/dL — ABNORMAL LOW (ref 13.0–17.0)
MCH: 30.8 pg (ref 26.0–34.0)
MCHC: 34.4 g/dL (ref 30.0–36.0)
MCHC: 36.3 g/dL — ABNORMAL HIGH (ref 30.0–36.0)
Platelets: 193 10*3/uL (ref 150–400)
RBC: 3.01 MIL/uL — ABNORMAL LOW (ref 4.22–5.81)
RDW: 13.2 % (ref 11.5–15.5)

## 2012-07-25 LAB — BASIC METABOLIC PANEL
BUN: 19 mg/dL (ref 6–23)
Calcium: 7.6 mg/dL — ABNORMAL LOW (ref 8.4–10.5)
Chloride: 97 mEq/L (ref 96–112)
GFR calc Af Amer: 51 mL/min — ABNORMAL LOW (ref >90)
GFR calc Af Amer: 50 mL/min — ABNORMAL LOW (ref >90)
GFR calc non Af Amer: 43 mL/min — ABNORMAL LOW (ref >90)
Potassium: 4.1 mEq/L (ref 3.5–5.1)
Sodium: 130 mEq/L — ABNORMAL LOW (ref 135–145)
Sodium: 130 mEq/L — ABNORMAL LOW (ref 135–145)

## 2012-07-25 LAB — PREPARE RBC (CROSSMATCH)

## 2012-07-25 MED ORDER — ACETAMINOPHEN 325 MG PO TABS
650.0000 mg | ORAL_TABLET | ORAL | Status: DC | PRN
  Administered 2012-07-25 – 2012-08-01 (×6): 650 mg via ORAL
  Filled 2012-07-25 (×5): qty 2

## 2012-07-25 MED ORDER — PROMETHAZINE HCL 25 MG/ML IJ SOLN
12.5000 mg | INTRAMUSCULAR | Status: DC | PRN
  Administered 2012-07-25 – 2012-08-09 (×39): 12.5 mg via INTRAVENOUS
  Filled 2012-07-25 (×41): qty 1

## 2012-07-25 MED ORDER — ONDANSETRON 8 MG/NS 50 ML IVPB
8.0000 mg | Freq: Four times a day (QID) | INTRAVENOUS | Status: DC | PRN
  Administered 2012-07-25 – 2012-08-08 (×5): 8 mg via INTRAVENOUS
  Filled 2012-07-25 (×6): qty 8

## 2012-07-25 MED ORDER — ACETAMINOPHEN 325 MG PO TABS
ORAL_TABLET | ORAL | Status: AC
  Administered 2012-07-25: 650 mg via ORAL
  Filled 2012-07-25: qty 2

## 2012-07-25 MED ORDER — METOCLOPRAMIDE HCL 5 MG/ML IJ SOLN
5.0000 mg | Freq: Four times a day (QID) | INTRAMUSCULAR | Status: DC
  Administered 2012-07-25 – 2012-07-29 (×16): 5 mg via INTRAVENOUS
  Administered 2012-07-29: 14:00:00 via INTRAVENOUS
  Administered 2012-07-29 – 2012-08-09 (×43): 5 mg via INTRAVENOUS
  Filled 2012-07-25 (×3): qty 1
  Filled 2012-07-25: qty 2
  Filled 2012-07-25: qty 1
  Filled 2012-07-25: qty 2
  Filled 2012-07-25: qty 1
  Filled 2012-07-25: qty 2
  Filled 2012-07-25: qty 1
  Filled 2012-07-25: qty 2
  Filled 2012-07-25 (×2): qty 1
  Filled 2012-07-25 (×2): qty 2
  Filled 2012-07-25 (×2): qty 1
  Filled 2012-07-25: qty 2
  Filled 2012-07-25 (×3): qty 1
  Filled 2012-07-25: qty 2
  Filled 2012-07-25 (×4): qty 1
  Filled 2012-07-25 (×3): qty 2
  Filled 2012-07-25 (×5): qty 1
  Filled 2012-07-25: qty 2
  Filled 2012-07-25 (×4): qty 1
  Filled 2012-07-25: qty 2
  Filled 2012-07-25 (×20): qty 1
  Filled 2012-07-25: qty 2
  Filled 2012-07-25 (×17): qty 1

## 2012-07-25 MED ORDER — BIOTENE DRY MOUTH MT LIQD
15.0000 mL | Freq: Two times a day (BID) | OROMUCOSAL | Status: DC
  Administered 2012-07-27 – 2012-08-08 (×21): 15 mL via OROMUCOSAL

## 2012-07-25 MED ORDER — METOCLOPRAMIDE HCL 5 MG/ML IJ SOLN: 10.0000 mg | Freq: Three times a day (TID) | INTRAMUSCULAR | Status: DC | PRN

## 2012-07-25 MED ORDER — SODIUM CHLORIDE 0.9 % IV BOLUS (SEPSIS)
1000.0000 mL | Freq: Once | INTRAVENOUS | Status: AC
  Administered 2012-07-25: 1000 mL via INTRAVENOUS

## 2012-07-25 MED ORDER — SODIUM CHLORIDE 0.9 % IV BOLUS (SEPSIS)
500.0000 mL | Freq: Once | INTRAVENOUS | Status: AC
  Administered 2012-07-25: 500 mL via INTRAVENOUS

## 2012-07-25 MED ORDER — ACETAMINOPHEN 650 MG RE SUPP: 650.0000 mg | Freq: Four times a day (QID) | RECTAL | Status: DC | PRN

## 2012-07-25 MED ORDER — CHLORHEXIDINE GLUCONATE 0.12 % MT SOLN
15.0000 mL | Freq: Two times a day (BID) | OROMUCOSAL | Status: DC
  Administered 2012-07-26 – 2012-08-09 (×27): 15 mL via OROMUCOSAL
  Filled 2012-07-25 (×30): qty 15

## 2012-07-25 MED ORDER — PANTOPRAZOLE SODIUM 40 MG IV SOLR
40.0000 mg | Freq: Every day | INTRAVENOUS | Status: DC
  Administered 2012-07-25 – 2012-08-02 (×9): 40 mg via INTRAVENOUS
  Filled 2012-07-25 (×11): qty 40

## 2012-07-25 MED ORDER — DEXTROSE-NACL 5-0.9 % IV SOLN
INTRAVENOUS | Status: DC
  Administered 2012-07-25 – 2012-07-28 (×9): via INTRAVENOUS
  Administered 2012-07-30: 125 mL/h via INTRAVENOUS
  Administered 2012-07-31: 10:00:00 via INTRAVENOUS

## 2012-07-25 MED ORDER — ACETAMINOPHEN 650 MG RE SUPP
650.0000 mg | Freq: Once | RECTAL | Status: AC
  Administered 2012-07-25: 650 mg via RECTAL
  Filled 2012-07-25: qty 1

## 2012-07-25 NOTE — Plan of Care (Signed)
Pt noted to be tachycardic to 130's. Has known perinephric hematoma following percutaneous stone surgery.   On exam he denies and dizziness, CP, SOB. No neph tubes, no foley.  Transferring to ICU, critical care consulted. Obtaining stat CBC ,BMP, T+C 4 units.   If hgb sig downtrend or non-respond to transfusion, may consider renal angio.  I am evaluating this patient for my colleague Dr. Isabel Caprice is operative presently and not readily available for bedside assessment.

## 2012-07-25 NOTE — Progress Notes (Signed)
Notified Dr. Isabel Caprice of patient's unimprovement after bolus.  Temp 101.9 oral, heart rate 136.  Breath sounds coarse, cough producing small amount of clear sputum.  He also continues to vomit small amounts of dark brown emesis.  Skin moist.  See new orders for stat chest xray.  Dr. Berneice Heinrich to consult with hospitalist.
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
## 2012-07-25 NOTE — Progress Notes (Signed)
Patient ID: Jared Mendez, male   DOB: 13-Sep-1971, 41 y.o.   MRN: [id number] 2 Days Post-Op Subjective: Patient reports ongoing problems with nausea and emesis. Multiple episodes of emesis last evening despite the Zofran. The patient's pain is relatively well controlled and he is attempted to avoid a narcotic pain medication. He does have some flank discomfort with movement. The patient was noted this morning to be tachycardic. 12-lead EKG suggest sinus tachycardia in the 130 range. Blood pressure has been maintained. The patient's hemoglobin has dropped 8.9 and clearly he does have a significant perinephric bleed/hematoma.  Objective: Vital signs in last 24 hours: Temp:  [98.1 F (36.7 C)-100.8 F (38.2 C)] 100.8 F (38.2 C) (09/25 0514) Pulse Rate:  [51-147] 147  (09/25 0514) Resp:  [16-20] 18  (09/25 0514) BP: (96-136)/(64-97) 112/74 mmHg (09/25 0514) SpO2:  [96 %-99 %] 96 % (09/25 0514)  Intake/Output from previous day: 09/24 0701 - 09/25 0700 In: 3061.7 [I.V.:1861.7; IV Piggyback:1200] Out: 704 [Urine:700; Emesis/NG output:3; Blood:1] Intake/Output this shift: Total I/O In: -  Out: 100 [Urine:100]  Physical Exam:  Constitutional: Vital signs reviewed. WD WN in NAD   Eyes: PERRL, No scleral icterus.   Cardiovascular: RRR Pulmonary/Chest: Normal effort Abdominal: Soft. Non-tender, non-distended, bowel sounds are normal, no masses, organomegaly, or guarding present.  Nephrostomy tube site is currently dry without active bleeding or obvious urinary leakage. Genitourinary: Not examined. Extremities: No cyanosis or edema   Lab Results:  Basename 07/25/12 0420 07/24/12 1553 07/24/12 0442  HGB 8.9* 10.1* 12.1*  HCT 25.9* 28.9* 35.5*   BMET  Basename 07/25/12 0420 07/24/12 0442  NA 130* 131*  K 4.1 3.9  CL 97 98  CO2 24 22  GLUCOSE 135* 111*  BUN 19 13  CREATININE 1.86* 1.39*  CALCIUM 7.8* 8.4    Basename 07/23/12 0800  LABPT --  INR 1.02   No results found for  this basename: LABURIN:1 in the last 72 hours Results for orders placed during the hospital encounter of 07/17/12  SURGICAL PCR SCREEN     Status: Normal   Collection Time   07/17/12  9:45 AM      Component Value Range Status Comment   MRSA, PCR NEGATIVE  NEGATIVE Final    Staphylococcus aureus NEGATIVE  NEGATIVE Final     Studies/Results: Ct Abdomen Pelvis Wo Contrast  07/24/2012  *RADIOLOGY REPORT*  Clinical Data: Right flank pain  CT ABDOMEN AND PELVIS WITHOUT CONTRAST  Technique:  Multidetector CT imaging of the abdomen and pelvis was performed following the standard protocol without intravenous contrast.  Comparison:  07/23/2012 percutaneous nephrostomy, 11/21/2011 CT.  Findings: Small right pleural effusion.  Trace left pleural effusion.  Associated consolidations.  Moderate hiatal hernia. Normal heart size.  Organ abnormality/lesion detection is limited in the absence of intravenous contrast. Within this limitation, unremarkable liver, spleen, pancreas, left adrenal gland.  Right adrenal gland is difficult to localize.  High attenuation within the gallbladder presumably represents vicarious excretion.  No biliary ductal dilatation.  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  Surgical suture along the stomach and left upper quadrant.  The right kidney is rotated and heterogeneous in attenuation. Percutaneous nephrostomy tube in place.  The tube balloon is positioned within the lower pole cortex.  There is high attenuation within the renal pelvis as well as locules of gas which extend inferiorly along the course of the right ureter. No residual stone fragments identified.  There is a 3.1 x  7.1 cm hematoma within the right posterior pararenal space.  Small amount of blood products extends inferiorly within the right paracolic gutter and anterior to the psoas.  There is a simple appearing perihepatic fluid.  No bowel obstruction.  No CT evidence for colitis.  No  free intraperitoneal air.  No lymphadenopathy.  Limited vascular evaluation without intravenous contrast.  No aneurysmal dilatation.  Decompressed bladder with a Foley catheter balloon in place.  No pelvic lymphadenopathy.  L1 vertebral body deformity is post traumatic.  No acute osseous finding.  IMPRESSION: Status post right percutaneous nephrostomy.  The nephrostomy tube tip is within the collecting system however the balloon appears to be within the cortex.  The right renal parenchyma is heterogeneous in attenuation which may reflect areas of retained contrast. Attention at follow-up.  Hematoma within the right posterior pararenal space as above.  High attenuation within the right collecting system is in keeping with retained contrast or blood products. No residual stone fragments identified.  Small right pleural effusion with associated consolidation, likely atelectasis.  Discussed via telephone with Dr. Grace Isaac at 08:30 a.m. on 07/24/2012.   Original Report Authenticated By: Waneta Martins, M.D.    Dg Abd 1 View  07/23/2012  *RADIOLOGY REPORT*  Clinical Data:  RIGHT renal calculi.  Status post nephrostomy access.  Dilatation of the nephrostomy tract is performed intraoperatively for nephrolithotomy.  DILATATION OF RIGHT PERCUTANEOUS NEPHROSTOMY TRACT  The nephrostomy catheter was removed over a guidewire in the operating room.  C-arm fluoroscopy was utilized.  A 9-French peel- away sheath was advanced into the ureter.  A second guidewire was then advanced into the bladder.  Percutaneous nephrostomy tract dilatation was performed with a 10 mm balloon.  A 30-French sheath was then advanced to the level of the renal calculus under C-arm fluoroscopy.  This sheath was utilized by Dr. Isabel Caprice during the nephrolithotomy procedure.  IMPRESSION: Intraoperative dilatation of right percutaneous nephrostomy tract for nephrolithotomy.   Original Report Authenticated By: Waynard Reeds, M.D.    Dg C-arm 1-60 Min-no  Report  07/23/2012  *RADIOLOGY REPORT*  Clinical Data:  RIGHT renal calculi.  Status post nephrostomy access.  Dilatation of the nephrostomy tract is performed intraoperatively for nephrolithotomy.  DILATATION OF RIGHT PERCUTANEOUS NEPHROSTOMY TRACT  The nephrostomy catheter was removed over a guidewire in the operating room.  C-arm fluoroscopy was utilized.  A 9-French peel- away sheath was advanced into the ureter.  A second guidewire was then advanced into the bladder.  Percutaneous nephrostomy tract dilatation was performed with a 10 mm balloon.  A 30-French sheath was then advanced to the level of the renal calculus under C-arm fluoroscopy.  This sheath was utilized by Dr. Isabel Caprice during the nephrolithotomy procedure.  IMPRESSION: Intraoperative dilatation of right percutaneous nephrostomy tract for nephrolithotomy.   Original Report Authenticated By: Waynard Reeds, M.D.    [place] Abts Cath Perc Right  07/23/2012  *RADIOLOGY REPORT*  Indication: Renal stones, access for right-sided percutaneous nephrolithotomy.  1.  FLUOROSCOPIC GUIDANCE FOR PUNCTURE OF THE RIGHT RENAL COLLECTING SYSTEM. 2. RIGHT SIDED PERCUTANEOUS NEPHROSTOMY TUBE PLACEMENT  Comparison: CT of the abdomen and pelvis - 11/21/2011; abdominal radiograph - 05/15/2012  Intravenous medications: Fentanyl 300 mcg IV; Versed 3 mg IV; Ciprofloxacin 400 mg IV; The antibiotic was administered in an appropriate time frame prior to skin puncture.  Total Moderate Sedation Time: 45 minutes.  Contrast: 50 mL Omnipaque-300 (administered into the collecting system)  Fluoroscopy Time: 19.7 minutes.  Complications:  None immediate  Procedure:  Informed written consent was obtained from the patient after a discussion of the risks, benefits, and alternatives to treatment. The right flank region was prepped with Betadine in a sterile fashion, and a sterile drape was applied covering the operative field.  A sterile gown and sterile gloves were used for the  procedure.  A timeout was performed prior to the initiation of the procedure.  A pre procedural spot fluoroscopic image was obtained of the upper abdomen.  Ultrasound scanning performed of the kidney was negative for significant hydronephrosis.  As such, the stone within inferior aspect of the right kidney was targeted fluoroscopically with a 22 gauge Chiba needle.  Access to the collecting system was confirmed with advancement of a Nitrex wire into the collecting system.  The needle was exchanged for the inner 3 French catheter from an Accustick set and contrast injection confirmed access.   A small amount of air was injected into the collecting system to help delineate a posterior calyx.  A posterior inferior calyx was targeted with a 22 gauge Chiba needle.  Access to the calyx was confirmed with advancement of a Nitrex wire into the collecting system.  An Accustick set was utilized to dilate the tract and was subsequently exchanged for a Kumpe catheter over a Bentson wire. The angled Rim catheter was advanced down the ureter and into the urinary bladder.  Postprocedural spot radiographs were obtained in various obliquities and the catheter was sutured to the skin.  The catheter was capped and a dressing was placed.  The patient tolerated the procedure well without immediate postprocedural complication.  Findings:  Pre procedural spot radiographic images demonstrates grossly unchanged appearance of these approximately 1.0 x 1.1 cm stone overlying the expected location of the inferior pole of the right kidney as demonstrated on recent abdominal CT.  Ultrasound scanning was negative for significant hydronephrosis and as such, the stone was targeted fluoroscopically allowing access to the collecting system.  Post contrast injection of the collecting system demonstrates lateral rotation of the right kidney with the renal pelvis and UPJ positioned laterally as demonstrated on preprocedural abdominal CT. The stone  appears in an indeterminate location, possibly within the inferior aspect of a nondilated renal pelvis versus the infundibulum supplying the posterior renal calyces.  Given the malpositioning of the right kidney, a slightly medial approach was utilized to access a now opacified posterior inferior calix via fluoroscopic guidance.  Ultimately a Rim angled catheter was advanced through this calix with tip ultimately terminating within the urinary bladder.  IMPRESSION:  1.  Successful fluoroscopic guided placement of a right sided 5 French angled Rim catheter to the level of the urinary bladder to be utilized during impending nephrolithotomy procedure.  2.  Malpositioned right kidney with renal pelvis directed laterally.  The existing renal stone is in an indeterminate location, either within the inferior aspect of a nondilated renal pelvis or the infundibulum supplying the inferior calyces.  Above findings discussed and reviewed with Dr. Isabel Caprice at the time of procedure completion.   Original Report Authenticated By: Waynard Reeds, M.D.     Assessment/Plan:   Patient has ongoing nausea and mild CVA tenderness. His hemoglobin continues to drop consistent with some perinephric bleeding. He does have an elevation in his creatinine I suspect this may be due to some component of urinoma and potential obstruction of that kidney. He is passing some dark urine which does suggest that he is getting output from that kidney  into the bladder. There were no residual stone fragments and therefore he should not have any ureteral obstruction. The patient will be given a fluid bolus with increased IV fluid this a.m. At this point a repeat CT is unlikely to really change my management and I would suspect we would see increased fluid around that kidney at this time. We will continue to monitor him closely from a clinical standpoint and obtain a repeat hemoglobin and hematocrit this afternoon and repeat blood work in the morning.  No indication for transfusion at this time. The patient does have a low-grade fever which I suspect is secondary to some atelectasis. The patient also had some evidence of a right-sided pleural effusion status post his percutaneous procedure which may be a contributing factor. There is really no evidence of obvious urosepsis.   LOS: 2 days   Valery Chance S 07/25/2012, 8:15 AM

## 2012-07-25 NOTE — Progress Notes (Signed)
Spoke with Dr. Laverle Patter regarding pt's HR of 145 and temp of 100.8 along with increasing WBC.  See new orders.

## 2012-07-26 ENCOUNTER — Inpatient Hospital Stay (HOSPITAL_COMMUNITY): Payer: MEDICAID

## 2012-07-26 LAB — CBC
HCT: 22.1 % — ABNORMAL LOW (ref 39.0–52.0)
Hemoglobin: 8 g/dL — ABNORMAL LOW (ref 13.0–17.0)
MCH: 30.4 pg (ref 26.0–34.0)
MCHC: 35.4 g/dL (ref 30.0–36.0)
MCHC: 36.2 g/dL — ABNORMAL HIGH (ref 30.0–36.0)
MCV: 85.9 fL (ref 78.0–100.0)
Platelets: 152 10*3/uL (ref 150–400)
RDW: 13.6 % (ref 11.5–15.5)
WBC: 13.2 10*3/uL — ABNORMAL HIGH (ref 4.0–10.5)

## 2012-07-26 LAB — GLUCOSE, CAPILLARY
Glucose-Capillary: 96 mg/dL (ref 70–99)
Glucose-Capillary: 109 mg/dL — ABNORMAL HIGH (ref 70–99)
Glucose-Capillary: 141 mg/dL — ABNORMAL HIGH (ref 70–99)

## 2012-07-26 LAB — BASIC METABOLIC PANEL
Calcium: 7 mg/dL — ABNORMAL LOW (ref 8.4–10.5)
Creatinine, Ser: 1.96 mg/dL — ABNORMAL HIGH (ref 0.50–1.35)
GFR calc Af Amer: 48 mL/min — ABNORMAL LOW (ref >90)

## 2012-07-26 LAB — APTT: aPTT: 47 seconds — ABNORMAL HIGH (ref 24–37)

## 2012-07-26 MED ORDER — CHLORPROMAZINE HCL 25 MG PO TABS
25.0000 mg | ORAL_TABLET | Freq: Three times a day (TID) | ORAL | Status: DC | PRN
  Administered 2012-07-26: 25 mg via ORAL
  Filled 2012-07-26: qty 1

## 2012-07-26 MED ORDER — INSULIN ASPART 100 UNIT/ML ~~LOC~~ SOLN
0.0000 [IU] | Freq: Three times a day (TID) | SUBCUTANEOUS | Status: DC
  Administered 2012-07-27: 2 [IU] via SUBCUTANEOUS
  Administered 2012-07-27 (×2): 3 [IU] via SUBCUTANEOUS
  Administered 2012-07-29 – 2012-08-06 (×15): 2 [IU] via SUBCUTANEOUS

## 2012-07-26 MED ORDER — BOOST / RESOURCE BREEZE PO LIQD
1.0000 | Freq: Two times a day (BID) | ORAL | Status: DC
  Administered 2012-07-26 – 2012-08-07 (×18): 1 via ORAL

## 2012-07-26 NOTE — Progress Notes (Signed)
CRITICAL VALUE ALERT  Critical value received:  hgb 5.7  Date of notification:  92613  Time of notification:  0507  Critical value read back:yes  Nurse who received alert:  Mayme Genta  MD notified (1st page):  Ashlyn Bruning  Time of first page:  0509  MD notified (2nd page): Ashlyn Bruning  Time of second ZOXW:[id number]  Responding MD:  Marcello Fennel   Time MD responded: [phone]
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
## 2012-07-26 NOTE — Progress Notes (Signed)
Patient ID: Jared Mendez, male   DOB: 09/10/1971, 41 y.o.   MRN: [id number] 3 Days Post-Op Subjective: Patient reports not a lot of clinical change. He is not having too much in the way of pain but is having ongoing consistent nausea. He has not had episodes of emesis. Patient's hemoglobin was found to be 5.7 this morning. What blood cell count was decreased to 13,000. He continues to be intermittently febrile but really not septic. Tachycardia appears to be secondary to acute blood loss anemia. Creatinine doesn't continue to increase for reasons that are not clear to me.  Objective: Vital signs in last 24 hours: Temp:  [98.4 F (36.9 C)-101.8 F (38.8 C)] 98.4 F (36.9 C) (09/26 0400) Pulse Rate:  [111-139] 116  (09/26 0600) Resp:  [19-28] 21  (09/26 0600) BP: (99-119)/(54-92) 99/55 mmHg (09/26 0600) SpO2:  [95 %-97 %] 97 % (09/26 0600) Weight:  [62.5 kg (137 lb 12.6 oz)] 62.5 kg (137 lb 12.6 oz) (09/26 0000)  Intake/Output from previous day: 09/25 0701 - 09/26 0700 In: 4401 [P.O.:240; I.V.:2700; IV Piggyback:1461] Out: 800 [Urine:800] Intake/Output this shift:    Physical Exam:  Constitutional: Vital signs reviewed. WD WN in NAD   Eyes: PERRL, No scleral icterus.   Cardiovascular: RRR Pulmonary/Chest: Normal effort Abdominal: Soft. Non-tender, non-distended. Nephrostomy site continues to be dry.  Genitourinary: No change. Extremities: No cyanosis or edema   Lab Results:  Basename 07/26/12 0320 07/25/12 1459 07/25/12 1105  HGB 5.7* 7.2* 7.8*  HCT 16.2* 20.0* 21.5*   BMET  Basename 07/26/12 0320 07/25/12 1105  NA 130* 130*  K 3.9 4.4  CL 104 100  CO2 20 20  GLUCOSE 124* 115*  BUN 17 22  CREATININE 1.96* 1.89*  CALCIUM 7.0* 7.6*    Basename 07/26/12 0320  LABPT --  INR 1.35   No results found for this basename: LABURIN:1 in the last 72 hours Results for orders placed during the hospital encounter of 07/17/12  SURGICAL PCR SCREEN     Status: Normal   Collection Time   07/17/12  9:45 AM      Component Value Range Status Comment   MRSA, PCR NEGATIVE  NEGATIVE Final    Staphylococcus aureus NEGATIVE  NEGATIVE Final     Studies/Results: Ct Abdomen Pelvis Wo Contrast  07/24/2012  *RADIOLOGY REPORT*  Clinical Data: Right flank pain  CT ABDOMEN AND PELVIS WITHOUT CONTRAST  Technique:  Multidetector CT imaging of the abdomen and pelvis was performed following the standard protocol without intravenous contrast.  Comparison:  07/23/2012 percutaneous nephrostomy, 11/21/2011 CT.  Findings: Small right pleural effusion.  Trace left pleural effusion.  Associated consolidations.  Moderate hiatal hernia. Normal heart size.  Organ abnormality/lesion detection is limited in the absence of intravenous contrast. Within this limitation, unremarkable liver, spleen, pancreas, left adrenal gland.  Right adrenal gland is difficult to localize.  High attenuation within the gallbladder presumably represents vicarious excretion.  No biliary ductal dilatation.  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  Surgical suture along the stomach and left upper quadrant.  The right kidney is rotated and heterogeneous in attenuation. Percutaneous nephrostomy tube in place.  The tube balloon is positioned within the lower pole cortex.  There is high attenuation within the renal pelvis as well as locules of gas which extend inferiorly along the course of the right ureter. No residual stone fragments identified.  There is a 3.1 x 7.1 cm hematoma within the right posterior pararenal  space.  Small amount of blood products extends inferiorly within the right paracolic gutter and anterior to the psoas.  There is a simple appearing perihepatic fluid.  No bowel obstruction.  No CT evidence for colitis.  No free intraperitoneal air.  No lymphadenopathy.  Limited vascular evaluation without intravenous contrast.  No aneurysmal dilatation.  Decompressed bladder with a  Foley catheter balloon in place.  No pelvic lymphadenopathy.  L1 vertebral body deformity is post traumatic.  No acute osseous finding.  IMPRESSION: Status post right percutaneous nephrostomy.  The nephrostomy tube tip is within the collecting system however the balloon appears to be within the cortex.  The right renal parenchyma is heterogeneous in attenuation which may reflect areas of retained contrast. Attention at follow-up.  Hematoma within the right posterior pararenal space as above.  High attenuation within the right collecting system is in keeping with retained contrast or blood products. No residual stone fragments identified.  Small right pleural effusion with associated consolidation, likely atelectasis.  Discussed via telephone with Dr. Grace Isaac at 08:30 a.m. on 07/24/2012.   Original Report Authenticated By: Waneta Martins, M.D.    Dg Chest 1 View  07/25/2012  *RADIOLOGY REPORT*  Clinical Data: Vomiting, tachycardia  CHEST - 1 VIEW  Comparison: 07/17/2012  Findings: Cardiomediastinal silhouette is stable.  No acute infiltrate or pleural effusion.  No pulmonary edema.  Surgical clips in left abdomen again noted.  IMPRESSION: No active disease.  No significant change.   Original Report Authenticated By: Natasha Mead, M.D.    Dg Abd 1 View  07/26/2012  *RADIOLOGY REPORT*  Clinical Data: Nausea.  Vomiting.  Abdominal distention.  ABDOMEN - 1 VIEW  Comparison: 07/24/2012 CT.  Findings: The right nephrostomy tube has been removed.  Surgical clips in the left abdomen appears similar.  Nonobstructive bowel gas pattern. Loss of the right renal shadow may be secondary retroperitoneal hematoma.  IMPRESSION: 1.  Interval removal of right nephrostomy tube. 2.  Nonobstructive bowel gas pattern.   Original Report Authenticated By: Andreas Newport, M.D.    Dg Chest Port 1 View  07/26/2012  *RADIOLOGY REPORT*  Clinical Data: Respiratory failure.  Follow-up.  PORTABLE CHEST - 1 VIEW  Comparison: 07/25/2012.   Findings: There is new hazy opacity over both sides of the chest, greater on the right than left.  This is greater than expected for ordinary atelectasis although there is almost certainly some atelectasis present.  Peripheral Kerley B lines are present. Findings compatible with interval development of right greater than left pulmonary edema.  Infection unlikely based on constellation of findings. Monitoring leads are projected over the chest.  The cardiopericardial silhouette appears within normal limits.  IMPRESSION: Interval development of hazy opacity bilaterally most consistent with interstitial and mild alveolar pulmonary edema.   Original Report Authenticated By: Andreas Newport, M.D.     Assessment/Plan:  Patient continues to have evidence of ongoing hemorrhage status post his percutaneous nephrolithotomy. He will be transfused 2 units of packed blood cells this morning ASAP. I will bring him down for a repeat noncontrast CT given his elevated creatinine to look at the status of the perinephric bleed/possible urinoma. We'll also discuss with interventional radiology the possible need for a renal angiogram/infarction.     LOS: 3 days   Greidy Sherard S 07/26/2012, 8:00 AM

## 2012-07-27 LAB — BASIC METABOLIC PANEL
CO2: 21 mEq/L (ref 19–32)
Calcium: 7.8 mg/dL — ABNORMAL LOW (ref 8.4–10.5)
GFR calc non Af Amer: 75 mL/min — ABNORMAL LOW (ref >90)
Glucose, Bld: 137 mg/dL — ABNORMAL HIGH (ref 70–99)
Potassium: 3.1 mEq/L — ABNORMAL LOW (ref 3.5–5.1)
Sodium: 137 mEq/L (ref 135–145)

## 2012-07-27 LAB — CBC
Hemoglobin: 8.3 g/dL — ABNORMAL LOW (ref 13.0–17.0)
MCH: 30 pg (ref 26.0–34.0)
MCHC: 35.5 g/dL (ref 30.0–36.0)
Platelets: 181 10*3/uL (ref 150–400)
RBC: 2.77 MIL/uL — ABNORMAL LOW (ref 4.22–5.81)

## 2012-07-27 LAB — TYPE AND SCREEN
ABO/RH(D): O POS
Unit division: 0
Unit division: 0

## 2012-07-27 LAB — URINE CULTURE: Culture: NO GROWTH

## 2012-07-27 LAB — GLUCOSE, CAPILLARY
Glucose-Capillary: 159 mg/dL — ABNORMAL HIGH (ref 70–99)
Glucose-Capillary: 138 mg/dL — ABNORMAL HIGH (ref 70–99)

## 2012-07-27 MED ORDER — POTASSIUM CHLORIDE CRYS ER 20 MEQ PO TBCR
40.0000 meq | EXTENDED_RELEASE_TABLET | Freq: Once | ORAL | Status: AC
  Administered 2012-07-27: 40 meq via ORAL
  Filled 2012-07-27: qty 2

## 2012-07-27 NOTE — Progress Notes (Signed)
CARE MANAGEMENT NOTE 07/27/2012  Patient:  Jared Mendez, Jared Mendez   Account Number:  [id number]  Date Initiated:  07/24/2012  Documentation initiated by:  Lanier Clam  Subjective/Objective Assessment:   ADMITTED W/RENAL STONE.NEPHROSTOMY.     Action/Plan:   FROM HOME   Anticipated DC Date:  07/30/2012   Anticipated DC Plan:  HOME/SELF CARE  In-house referral  Financial Counselor  PCP / Health Connect      DC Planning Services  CM consult  Medication Assistance      Choice offered to / List presented to:             Status of service:  In process, will continue to follow Medicare Important Message given?   (If response is "NO", the following Medicare IM given date fields will be blank) Date Medicare IM given:   Date Additional Medicare IM given:    Discharge Disposition:    Per UR Regulation:  Reviewed for med. necessity/level of care/duration of stay  If discussed at Long Length of Stay Meetings, dates discussed:    Comments:  [id number]/Rhonda Earlene Plater, RN, BSN, CCM: CHART REVIEWED AND UPDATED. NO DISCHARGE NEEDS PRESENT AT THIS TIME. CASE MANAGEMENT [phone]   07/25/12 KATHY MAHABIR RN,BSN NCM [phone] TRANSFERRED TO SDU-TACHY,TEMP,VOMITING DARK BROWN EMESIS.RAPID RESPONSE YESTERDAY-UNRESPONSIVE,BLOOD @ NEPHROSTOMY SITE. QUALIFIES FOR INDIGENT FUNDS IF NEEDED.PROVIDED W/PCP LISTING/HEALTH CONNECT/COMMUNITY RESOURCES.  07/24/12 KATHY MAHABIR RN,BSN NCM [phone]

## 2012-07-27 NOTE — Progress Notes (Signed)
Notified Dr. Karrie Doffing on call of bloody discharge and clots from urethra.  Bp and Pulse remain unchanged.  Will continue to monitor.
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
## 2012-07-27 NOTE — Progress Notes (Signed)
Reinforced patient education concerning incentive spirometry.  Discussed the importance of using the incentive spirometry.  Also discussed the importance of mobility.  The patient sit up in a chair for 2 hours.  Tolerated well.

## 2012-07-27 NOTE — Progress Notes (Signed)
Patient ID: Jared Mendez, male   DOB: 16-Jun-1971, 41 y.o.   MRN: [id number] 4 Days Post-Op Subjective: Patient reports having a better night last night. He had several hours with no significant pain or nausea. He does report moderate pain this morning with recurrent nausea but no emesis.  Objective: Vital signs in last 24 hours: Temp:  [98.6 F (37 C)-101.1 F (38.4 C)] 98.7 F (37.1 C) (09/27 0800) Pulse Rate:  [112-132] 117  (09/27 0600) Resp:  [21-28] 26  (09/27 0600) BP: (93-128)/(50-76) 128/73 mmHg (09/27 0600) SpO2:  [93 %-99 %] 95 % (09/27 0600) Weight:  [63.8 kg (140 lb 10.5 oz)] 63.8 kg (140 lb 10.5 oz) (09/27 0001)  Intake/Output from previous day: 09/26 0701 - 09/27 0700 In: 3590 [P.O.:240; I.V.:2175; Blood:715; IV Piggyback:460] Out: 3575 [Urine:3575] Intake/Output this shift: Total I/O In: -  Out: 125 [Urine:125]  Physical Exam:  Constitutional: Vital signs reviewed. WD WN in NAD   Eyes: PERRL, No scleral icterus.   Cardiovascular: RRR Pulmonary/Chest: Normal effort Abdominal: Soft. Non-tender, non-distended, bowel sounds are normal, no masses, organomegaly, or guarding present.  Genitourinary: Extremities: No cyanosis or edema   Lab Results:  Basename 07/27/12 0340 07/26/12 1811 07/26/12 0320  HGB 8.3* 8.0* 5.7*  HCT 23.4* 22.1* 16.2*   BMET  Basename 07/27/12 0340 07/26/12 0320  NA 137 130*  K 3.1* 3.9  CL 108 104  CO2 21 20  GLUCOSE 137* 124*  BUN 9 17  CREATININE 1.19 1.96*  CALCIUM 7.8* 7.0*    Basename 07/26/12 0320  LABPT --  INR 1.35   No results found for this basename: LABURIN:1 in the last 72 hours Results for orders placed during the hospital encounter of 07/17/12  SURGICAL PCR SCREEN     Status: Normal   Collection Time   07/17/12  9:45 AM      Component Value Range Status Comment   MRSA, PCR NEGATIVE  NEGATIVE Final    Staphylococcus aureus NEGATIVE  NEGATIVE Final     Studies/Results: Ct Abdomen Pelvis Wo  Contrast  07/26/2012  *RADIOLOGY REPORT*  Clinical Data: Flank pain, status post nephrostomy, decreased hemoglobin  CT ABDOMEN AND PELVIS WITHOUT CONTRAST  Technique:  Multidetector CT imaging of the abdomen and pelvis was performed following the standard protocol without intravenous contrast.  Comparison: 07/24/2012  Findings: Patchy left lower lobe opacity, suspicious for pneumonia, with associated trace left pleural effusion.  Moderate right pleural effusion with associated right lower lobe opacity, possibly atelectasis.  Prior gastric surgery.  Multiple additional surgical clips in the left upper abdomen.  Unenhanced liver, spleen, pancreas, and adrenal glands are unremarkable.  Interval removal of prior large bore right nephrostomy catheter. Associated right perinephric hematoma is grossly unchanged. Hemorrhage extends into the posterior pararenal space and inferiorly along the anterior aspect of the right psoas muscle. Representative cross-sectional measurements are 3.5 x 7.1 cm (series 2/image 29), previously 3.0 x 7.3 cm.  Small amount of hemorrhage in the right upper collecting system (series 2/image 31), unchanged.  Left kidney is grossly unremarkable.  No evidence of bowel obstruction.  No evidence of abdominal aortic aneurysm.  Prostate is unremarkable.  Bladder is within normal limits.  Stable post-traumatic deformity of the L1 vertebral body.  IMPRESSION: Interval removal of prior large bore right nephrostomy catheter.  Associated right perinephric/pararenal hematoma is grossly unchanged, as described above.  Patchy left lower lobe opacity, suspicious for pneumonia, with associated trace left pleural effusion.  Moderate right pleural effusion with  associated lower lobe opacity, possibly atelectasis.   Original Report Authenticated By: Charline Bills, M.D.    Dg Chest 1 View  07/25/2012  *RADIOLOGY REPORT*  Clinical Data: Vomiting, tachycardia  CHEST - 1 VIEW  Comparison: 07/17/2012  Findings:  Cardiomediastinal silhouette is stable.  No acute infiltrate or pleural effusion.  No pulmonary edema.  Surgical clips in left abdomen again noted.  IMPRESSION: No active disease.  No significant change.   Original Report Authenticated By: Natasha Mead, M.D.    Dg Abd 1 View  07/26/2012  *RADIOLOGY REPORT*  Clinical Data: Nausea.  Vomiting.  Abdominal distention.  ABDOMEN - 1 VIEW  Comparison: 07/24/2012 CT.  Findings: The right nephrostomy tube has been removed.  Surgical clips in the left abdomen appears similar.  Nonobstructive bowel gas pattern. Loss of the right renal shadow may be secondary retroperitoneal hematoma.  IMPRESSION: 1.  Interval removal of right nephrostomy tube. 2.  Nonobstructive bowel gas pattern.   Original Report Authenticated By: Andreas Newport, M.D.    Dg Chest Port 1 View  07/26/2012  *RADIOLOGY REPORT*  Clinical Data: Respiratory failure.  Follow-up.  PORTABLE CHEST - 1 VIEW  Comparison: 07/25/2012.  Findings: There is new hazy opacity over both sides of the chest, greater on the right than left.  This is greater than expected for ordinary atelectasis although there is almost certainly some atelectasis present.  Peripheral Kerley B lines are present. Findings compatible with interval development of right greater than left pulmonary edema.  Infection unlikely based on constellation of findings. Monitoring leads are projected over the chest.  The cardiopericardial silhouette appears within normal limits.  IMPRESSION: Interval development of hazy opacity bilaterally most consistent with interstitial and mild alveolar pulmonary edema.   Original Report Authenticated By: Andreas Newport, M.D.     Assessment/Plan:   Better. [name] hemoglobin is stable. He had an appropriate response to 2 units of packed blood cells and no further evidence of ongoing bleeding at this time. Renal function is now normal. We will continue to observe him as a step down status today. If he if he does well  he should be able to be transferred to the floor over the weekend and for probable discharge on Monday if he continues to improve.   LOS: 4 days   Jared Mendez 07/27/2012, 8:08 AM

## 2012-07-28 ENCOUNTER — Inpatient Hospital Stay (HOSPITAL_COMMUNITY): Payer: MEDICAID

## 2012-07-28 LAB — CBC
HCT: 21 % — ABNORMAL LOW (ref 39.0–52.0)
Hemoglobin: 7.6 g/dL — ABNORMAL LOW (ref 13.0–17.0)
MCH: 30.5 pg (ref 26.0–34.0)
MCV: 84.3 fL (ref 78.0–100.0)
RBC: 2.49 MIL/uL — ABNORMAL LOW (ref 4.22–5.81)
WBC: 15.8 10*3/uL — ABNORMAL HIGH (ref 4.0–10.5)

## 2012-07-28 LAB — GLUCOSE, CAPILLARY
Glucose-Capillary: 91 mg/dL (ref 70–99)
Glucose-Capillary: 95 mg/dL (ref 70–99)
Glucose-Capillary: 98 mg/dL (ref 70–99)
Glucose-Capillary: 113 mg/dL — ABNORMAL HIGH (ref 70–99)

## 2012-07-28 LAB — BASIC METABOLIC PANEL
BUN: 7 mg/dL (ref 6–23)
CO2: 20 mEq/L (ref 19–32)
Calcium: 7.9 mg/dL — ABNORMAL LOW (ref 8.4–10.5)
Chloride: 102 mEq/L (ref 96–112)
Creatinine, Ser: 1.18 mg/dL (ref 0.50–1.35)
Glucose, Bld: 118 mg/dL — ABNORMAL HIGH (ref 70–99)

## 2012-07-28 MED ORDER — BELLADONNA ALKALOIDS-OPIUM 16.2-60 MG RE SUPP
1.0000 | Freq: Three times a day (TID) | RECTAL | Status: DC | PRN
  Administered 2012-08-01 – 2012-08-07 (×9): 1 via RECTAL
  Filled 2012-07-28 (×10): qty 1

## 2012-07-28 NOTE — Progress Notes (Signed)
Order for Jared Mendez obtained from Dr. Isabel Caprice due to pt inability to empty bladder.  C/O sudden increase pressure, abd is firmer than previously noted. Bladder scan shows >400 PVR.  18 Mendez foley placed without difficulty, draining well. Will continue to monitor. Barnett Hatter P

## 2012-07-28 NOTE — Progress Notes (Addendum)
Patient ID: Jared Mendez, male   DOB: Mar 11, 1971, 41 y.o.   MRN: [id number]  5 Days Post-Op Subjective: Patient passed large clots via his urethra last night and then had difficulty voiding requiring placement of an 18Fr catheter last night.  UOP has since been good although he has required periodic irrigation by nursing staff. Still nauseated.  Objective: Vital signs in last 24 hours: Temp:  [98.4 F (36.9 C)-99.1 F (37.3 C)] 98.5 F (36.9 C) (09/28 0800) Pulse Rate:  [108-119] 117  (09/28 0400) Resp:  [22-28] 24  (09/28 0400) BP: (92-128)/(66-87) 128/87 mmHg (09/28 0400) SpO2:  [95 %-100 %] 97 % (09/28 0400)  Intake/Output from previous day: 09/27 0701 - 09/28 0700 In: 4044 [P.O.:1794; I.V.:2150; IV Piggyback:100] Out: 3525 [Urine:3525] Intake/Output this shift:    Physical Exam:  General: Alert and oriented CV: RRR Lungs: Clear Abdomen: Soft, ND GU: Urine light pink Ext: NT, No erythema  Lab Results:  Basename 07/28/12 0340 07/27/12 0340 07/26/12 1811  HGB 7.6* 8.3* 8.0*  HCT 21.0* 23.4* 22.1*   BMET  Basename 07/28/12 0340 07/27/12 0340  NA 133* 137  K 3.1* 3.1*  CL 102 108  CO2 20 21  GLUCOSE 118* 137*  BUN 7 9  CREATININE 1.18 1.19  CALCIUM 7.9* 7.8*   Lab Results  Component Value Date   WBC 15.8* 07/28/2012   HGB 7.6* 07/28/2012   HCT 21.0* 07/28/2012   MCV 84.3 07/28/2012   PLT 213 07/28/2012     Studies/Results: Ct Abdomen Pelvis Wo Contrast  07/26/2012  *RADIOLOGY REPORT*  Clinical Data: Flank pain, status post nephrostomy, decreased hemoglobin  CT ABDOMEN AND PELVIS WITHOUT CONTRAST  Technique:  Multidetector CT imaging of the abdomen and pelvis was performed following the standard protocol without intravenous contrast.  Comparison: 07/24/2012  Findings: Patchy left lower lobe opacity, suspicious for pneumonia, with associated trace left pleural effusion.  Moderate right pleural effusion with associated right lower lobe opacity, possibly  atelectasis.  Prior gastric surgery.  Multiple additional surgical clips in the left upper abdomen.  Unenhanced liver, spleen, pancreas, and adrenal glands are unremarkable.  Interval removal of prior large bore right nephrostomy catheter. Associated right perinephric hematoma is grossly unchanged. Hemorrhage extends into the posterior pararenal space and inferiorly along the anterior aspect of the right psoas muscle. Representative cross-sectional measurements are 3.5 x 7.1 cm (series 2/image 29), previously 3.0 x 7.3 cm.  Small amount of hemorrhage in the right upper collecting system (series 2/image 31), unchanged.  Left kidney is grossly unremarkable.  No evidence of bowel obstruction.  No evidence of abdominal aortic aneurysm.  Prostate is unremarkable.  Bladder is within normal limits.  Stable post-traumatic deformity of the L1 vertebral body.  IMPRESSION: Interval removal of prior large bore right nephrostomy catheter.  Associated right perinephric/pararenal hematoma is grossly unchanged, as described above.  Patchy left lower lobe opacity, suspicious for pneumonia, with associated trace left pleural effusion.  Moderate right pleural effusion with associated lower lobe opacity, possibly atelectasis.   Original Report Authenticated By: Charline Bills, M.D.    Dg Chest Port 1 View  07/28/2012  *RADIOLOGY REPORT*  Clinical Data: Pneumonia versus pulmonary edema.  PORTABLE CHEST - 1 VIEW  Comparison: Chest 07/26/2012 and 07/25/2012.  Findings: Hazy bilateral pulmonary opacities have worsened.  Right pleural effusion is now identified.  No pneumothorax.  Heart size upper normal.  Remote left clavicle fracture noted.  No pneumothorax.  IMPRESSION: Worsened bilateral airspace opacities and right effusion most  likely due to pulmonary edema with pulmonary hemorrhage or pneumonia felt less likely.   Original Report Authenticated By: Bernadene Bell. Maricela Curet, M.D.     I irrigated his catheter with inability to  irrigate well.  18 Fr catheter removed and new 22 Fr 3 way hematuria catheter placed. This was irrigated and multiple large clots removed with clearing of urine suggesting clots are old.  Assessment/Plan: -- Do not suspect ongoing bleeding.  Follow Hgb.  Transfer to floor.  Will irrigate catheter prn and monitor hematuria and for anymore clots.  Likely there are still some remaining clots. -- Renal function improved. -- Will advance diet once nausea improved. Nausea may be related to perinephric bleed and clot obstruction. -- Increased WBC - Check urine culture.  Continue Cipro.  Follow CBC.   LOS: 5 days   Jenin Birdsall,LES 07/28/2012, 8:58 AM

## 2012-07-29 ENCOUNTER — Inpatient Hospital Stay (HOSPITAL_COMMUNITY): Payer: MEDICAID

## 2012-07-29 LAB — GLUCOSE, CAPILLARY
Glucose-Capillary: 126 mg/dL — ABNORMAL HIGH (ref 70–99)
Glucose-Capillary: 124 mg/dL — ABNORMAL HIGH (ref 70–99)
Glucose-Capillary: 112 mg/dL — ABNORMAL HIGH (ref 70–99)

## 2012-07-29 LAB — BASIC METABOLIC PANEL
Calcium: 8.2 mg/dL — ABNORMAL LOW (ref 8.4–10.5)
Creatinine, Ser: 1.45 mg/dL — ABNORMAL HIGH (ref 0.50–1.35)
GFR calc Af Amer: 68 mL/min — ABNORMAL LOW (ref >90)
GFR calc non Af Amer: 59 mL/min — ABNORMAL LOW (ref >90)
Sodium: 133 mEq/L — ABNORMAL LOW (ref 135–145)

## 2012-07-29 LAB — URINE CULTURE
Colony Count: NO GROWTH
Culture: NO GROWTH

## 2012-07-29 LAB — CBC
HCT: 21 % — ABNORMAL LOW (ref 39.0–52.0)
Hemoglobin: 7.5 g/dL — ABNORMAL LOW (ref 13.0–17.0)
MCH: 30 pg (ref 26.0–34.0)
MCHC: 35.7 g/dL (ref 30.0–36.0)
MCV: 84 fL (ref 78.0–100.0)
Platelets: 284 10*3/uL (ref 150–400)
RBC: 2.5 MIL/uL — ABNORMAL LOW (ref 4.22–5.81)
RDW: 14.1 % (ref 11.5–15.5)
WBC: 23.4 10*3/uL — ABNORMAL HIGH (ref 4.0–10.5)

## 2012-07-29 LAB — EXPECTORATED SPUTUM ASSESSMENT W GRAM STAIN, RFLX TO RESP C

## 2012-07-29 MED ORDER — DEXTROSE 5 % IV SOLN
1.0000 g | INTRAVENOUS | Status: DC
  Administered 2012-07-29 – 2012-07-30 (×2): 1 g via INTRAVENOUS
  Filled 2012-07-29 (×2): qty 10

## 2012-07-29 MED ORDER — BISACODYL 10 MG RE SUPP
10.0000 mg | Freq: Once | RECTAL | Status: AC
  Administered 2012-07-29: 10 mg via RECTAL
  Filled 2012-07-29: qty 1

## 2012-07-29 NOTE — Progress Notes (Signed)
Pt has been up to BR with SBA. He did ambulate length of hallway with nurse standing by. Pt states he feels better today. Afibrile. Urine pink tinged will observe closely, as Dr. Robet Leu did irrigate with return of several blood clots this am.

## 2012-07-29 NOTE — Progress Notes (Addendum)
Patient ID: NEKHI LIWANAG, male   DOB: Oct 24, 1971, 41 y.o.   MRN: [id number]  6 Days Post-Op Subjective: Pt still with significant nausea and emesis x 1.  Fever and chills last night.  Improved now. Catheter has been draining without the need for irrigation since yesterday but still tea colored.   Objective: Vital signs in last 24 hours: Temp:  [98.2 F (36.8 C)-101.9 F (38.8 C)] 100 F (37.8 C) (09/29 1308) Pulse Rate:  [111-115] 111  (09/29 6578) Resp:  [19-20] 19  (09/29 4696) BP: (116-132)/(84-87) 131/87 mmHg (09/29 0632) SpO2:  [96 %-97 %] 97 % (09/29 2952)  Intake/Output from previous day: 09/28 0701 - 09/29 0700 In: 2777 [P.O.:840; I.V.:900; IV Piggyback:212] Out: 3400 [Urine:3400] Intake/Output this shift:    Physical Exam:  General: Alert and oriented CV: Regular, tachycardic Lungs: Clear Abdomen: Mild distention, Tender to palpation especially over right upper quadrant, No rebound tenderness or guarding GU: Urine tea colored but draining well Ext: NT, No erythema  Lab Results:  Basename 07/29/12 0521 07/28/12 0340 07/27/12 0340  HGB 7.5* 7.6* 8.3*  HCT 21.0* 21.0* 23.4*   BMET  Basename 07/29/12 0521 07/28/12 0340  NA 133* 133*  K 3.4* 3.1*  CL 101 102  CO2 22 20  GLUCOSE 124* 118*  BUN 9 7  CREATININE 1.45* 1.18  CALCIUM 8.2* 7.9*   Lab Results  Component Value Date   WBC 23.4* 07/29/2012   HGB 7.5* 07/29/2012   HCT 21.0* 07/29/2012   MCV 84.0 07/29/2012   PLT 284 07/29/2012    Studies/Results: Dg Chest Port 1 View  07/28/2012  *RADIOLOGY REPORT*  Clinical Data: Pneumonia versus pulmonary edema.  PORTABLE CHEST - 1 VIEW  Comparison: Chest 07/26/2012 and 07/25/2012.  Findings: Hazy bilateral pulmonary opacities have worsened.  Right pleural effusion is now identified.  No pneumothorax.  Heart size upper normal.  Remote left clavicle fracture noted.  No pneumothorax.  IMPRESSION: Worsened bilateral airspace opacities and right effusion most likely due  to pulmonary edema with pulmonary hemorrhage or pneumonia felt less likely.   Original Report Authenticated By: Bernadene Bell. Maricela Curet, M.D.    Urine culture from 07/28/12 pending.  I irrigated catheter this morning with multiple large clots removed.  There are likely remaining formed clots as catheter still not irrigating perfectly although no further clots could be removed with irrigation at this time.  Catheter now draining well again.  Assessment/Plan: 1) Acute blood loss anemia s/p L PCNL: Hgb is relatively stable. Do not suspect ongoing blood loss now.  Continue to monitor Hgb. 2) Clot urinary obstruction: Hematuria catheter in place and draining well. Will leave in place.  If not able to get clots removed with irrigation via catheter, may need cysto with clot evacuation early this week. 3) Fever and increased WBC: Will check CXR, blood cultures.  Most likely sources would be pneumonia or urine.  Will change to broad spectrum antibiotics and check sputum culture.  Monitor WBC and culture results. 4) Nausea/vomiting: Likely related to other issues.  If not improving, will need to consider acute abdominal series or repeat CT especially if WBC continues to increase. 5) DVT prophylaxis: Not an appropriate candidate for pharmacologic prophylaxis due to recent bleeding. Encouraged ambulation. SCDs in place.   LOS: 6 days   Johnnae Impastato,LES 07/29/2012, 9:40 AM

## 2012-07-30 ENCOUNTER — Inpatient Hospital Stay (HOSPITAL_COMMUNITY): Payer: MEDICAID

## 2012-07-30 DIAGNOSIS — J90 Pleural effusion, not elsewhere classified: Secondary | ICD-10-CM

## 2012-07-30 LAB — BASIC METABOLIC PANEL
CO2: 23 mEq/L (ref 19–32)
Calcium: 8.6 mg/dL (ref 8.4–10.5)
GFR calc non Af Amer: 57 mL/min — ABNORMAL LOW (ref >90)
Sodium: 136 mEq/L (ref 135–145)

## 2012-07-30 LAB — CBC
MCH: 29.5 pg (ref 26.0–34.0)
MCHC: 34.8 g/dL (ref 30.0–36.0)
Platelets: 397 10*3/uL (ref 150–400)
RBC: 2.75 MIL/uL — ABNORMAL LOW (ref 4.22–5.81)

## 2012-07-30 LAB — GLUCOSE, CAPILLARY

## 2012-07-30 MED ORDER — PIPERACILLIN-TAZOBACTAM 3.375 G IVPB
3.3750 g | Freq: Three times a day (TID) | INTRAVENOUS | Status: DC
  Administered 2012-07-30 – 2012-08-06 (×21): 3.375 g via INTRAVENOUS
  Filled 2012-07-30 (×22): qty 50

## 2012-07-30 MED ORDER — VANCOMYCIN HCL 1000 MG IV SOLR
750.0000 mg | Freq: Two times a day (BID) | INTRAVENOUS | Status: DC
  Administered 2012-07-30 – 2012-08-05 (×13): 750 mg via INTRAVENOUS
  Filled 2012-07-30 (×14): qty 750
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
## 2012-07-30 NOTE — Progress Notes (Signed)
Pt initially complained of pain at 1930, stating that he felt like he had a clot. Pt complained that he was starting to feel pressure in his stomach. Foley drainage bag was noted to be bloody with huge clots. Approximately 400 cc in drainage bag, but unable to drain because of the clots in the the bag. Foley catheter irrigated with a total of 500cc of NS with some small clots noted after irrigation. Pt states that his stomach was starting to feel a little better after irrigation. Drainage bag changed due to the huge clots blocking the catheter from adequately draining. Catheter now draining  A small amount of blood tinged urine, will continue to monitor for clots and adequate draining.

## 2012-07-30 NOTE — Progress Notes (Signed)
Patient ID: QUY LOTTS, male   DOB: 1971/04/22, 41 y.o.   MRN: [id number] 7 Days Post-Op Subjective: Patient reports moderate discomfort with mild shortness of breath and some nausea. The patient appeared to be clearly better before the weekend with stabilization of his hemoglobin and an improvement in his renal function. Unfortunately over the weekend he is now continued to spike temperatures. Urine cultures negative thus far. Patient's white blood cell count has gone up dramatically to close to 33,000. Creatinine has increased somewhat. Chest x-ray reveals questionable pulmonary edema versus infiltrates an increase in the right-sided pleural effusion. Urine is draining a light tea-colored. The patient did develop gross hematuria with clots over the weekend. He is required irrigation but does not appear to have active bleeding and his hemoglobin has again stabilize.  Objective: Vital signs in last 24 hours: Temp:  [98 F (36.7 C)-102.4 F (39.1 C)] 99.8 F (37.7 C) (09/30 0950) Pulse Rate:  [102-134] 134  (09/30 0539) Resp:  [16-20] 18  (09/30 0539) BP: (120-139)/(68-89) 126/68 mmHg (09/30 0539) SpO2:  [97 %-99 %] 97 % (09/30 0539)  Intake/Output from previous day: 09/29 0701 - 09/30 0700 In: 3561.8 [P.O.:1800; I.V.:1698.8; IV Piggyback:63] Out: 2951 [Urine:2950; Stool:1] Intake/Output this shift:    Physical Exam:  Constitutional: Vital signs reviewed. WD WN in NAD   Eyes: PERRL, No scleral icterus.   Cardiovascular: RRR Pulmonary/Chest: Normal effort Abdominal: Soft. Non-tender, non-distended, bowel sounds are normal, no masses, organomegaly, or guarding present.  Genitourinary: Indwelling Foley catheter. Draining light tea-colored urine. Extremities: No cyanosis or edema   Lab Results:  Basename 07/30/12 0410 07/29/12 0521 07/28/12 0340  HGB 8.1* 7.5* 7.6*  HCT 23.3* 21.0* 21.0*   BMET  Basename 07/30/12 0410 07/29/12 0521  NA 136 133*  K 3.5 3.4*  CL 102 101  CO2 23  22  GLUCOSE 106* 124*  BUN 10 9  CREATININE 1.50* 1.45*  CALCIUM 8.6 8.2*   No results found for this basename: LABPT:3,INR:3 in the last 72 hours No results found for this basename: LABURIN:1 in the last 72 hours Results for orders placed during the hospital encounter of 07/23/12  URINE CULTURE     Status: Normal   Collection Time   07/26/12  2:50 PM      Component Value Range Status Comment   Specimen Description URINE, RANDOM   Final    Special Requests NONE   Final    Culture  Setup Time 07/27/2012 01:08   Final    Colony Count NO GROWTH   Final    Culture NO GROWTH   Final    Report Status 07/27/2012 FINAL   Final   URINE CULTURE     Status: Normal   Collection Time   07/28/12  9:43 AM      Component Value Range Status Comment   Specimen Description URINE, CLEAN CATCH   Final    Special Requests NONE   Final    Culture  Setup Time 07/28/2012 17:27   Final    Colony Count NO GROWTH   Final    Culture NO GROWTH   Final    Report Status 07/29/2012 FINAL   Final   CULTURE, BLOOD (ROUTINE X 2)     Status: Normal (Preliminary result)   Collection Time   07/29/12 10:46 AM      Component Value Range Status Comment   Specimen Description BLOOD LEFT ARM   Final    Special Requests BOTTLES DRAWN AEROBIC AND  ANAEROBIC 5 CC EACH   Final    Culture  Setup Time 07/29/2012 14:00   Final    Culture     Final    Value:        BLOOD CULTURE RECEIVED NO GROWTH TO DATE CULTURE WILL BE HELD FOR 5 DAYS BEFORE ISSUING A FINAL NEGATIVE REPORT   Report Status PENDING   Incomplete   CULTURE, BLOOD (ROUTINE X 2)     Status: Normal (Preliminary result)   Collection Time   07/29/12 10:50 AM      Component Value Range Status Comment   Specimen Description BLOOD LEFT HAND   Final    Special Requests BOTTLES DRAWN AEROBIC AND ANAEROBIC 5 CC EACH   Final    Culture  Setup Time 07/29/2012 14:00   Final    Culture     Final    Value:        BLOOD CULTURE RECEIVED NO GROWTH TO DATE CULTURE WILL BE HELD  FOR 5 DAYS BEFORE ISSUING A FINAL NEGATIVE REPORT   Report Status PENDING   Incomplete   CULTURE, EXPECTORATED SPUTUM-ASSESSMENT     Status: Normal   Collection Time   07/29/12 12:29 PM      Component Value Range Status Comment   Specimen Description SPUTUM   Final    Special Requests NONE   Final    Sputum evaluation     Final    Value: THIS SPECIMEN IS ACCEPTABLE. RESPIRATORY CULTURE REPORT TO FOLLOW.   Report Status 07/29/2012 FINAL   Final     Studies/Results: Dg Chest 2 View  07/29/2012  *RADIOLOGY REPORT*  Clinical Data: Shortness of breath.  Weakness and fever.  CHEST - 2 VIEW  Comparison: 1 day prior  Findings: Surgical clips in the left upper abdomen.  Borderline cardiomegaly.  Probable small right pleural effusion, new or increased. No pneumothorax.  Perihilar interstitial and right greater than left alveolar opacities are progressive.  IMPRESSION: Worsened right greater than left interstitial and alveolar opacities.  Favor pulmonary edema.  Given the history of weakness and fever, multifocal infection cannot be excluded.  New or enlarging right-sided pleural effusion.   Original Report Authenticated By: Consuello Bossier, M.D.     Assessment/Plan:    Mr. Thilges does not appear to have significant ongoing bleeding at this time. His urine is a light Ross collar and his hemoglobin has been stable. My biggest concern is his ongoing fevers with elevated white blood cell count. He has had 2 recent urine cultures which were negative and has not had any documented urosepsis. I am concerned that this may be a pulmonary source. I've asked pulmonary/critical care to reevaluate the patient to determine if any additional imaging studies are required with regard to the chest and whether the patient would benefit from drainage of his right-sided effusion with cultures of that effusion. I will probably remove his Foley catheter tomorrow if his urine remains relatively clear. He will remain on Rocephin at  this time but will certainly change antibiotics per any pulmonary recommendations.   LOS: 7 days   Yunus Stoklosa S 07/30/2012, 9:54 AM
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
## 2012-07-31 ENCOUNTER — Inpatient Hospital Stay (HOSPITAL_COMMUNITY): Payer: MEDICAID

## 2012-07-31 DIAGNOSIS — K567 Ileus, unspecified: Secondary | ICD-10-CM | POA: Diagnosis not present

## 2012-07-31 DIAGNOSIS — J189 Pneumonia, unspecified organism: Secondary | ICD-10-CM

## 2012-07-31 DIAGNOSIS — K560 Paralytic ileus: Secondary | ICD-10-CM

## 2012-07-31 DIAGNOSIS — A419 Sepsis, unspecified organism: Secondary | ICD-10-CM

## 2012-07-31 LAB — CULTURE, RESPIRATORY W GRAM STAIN

## 2012-07-31 LAB — CBC
Hemoglobin: 6.2 g/dL — CL (ref 13.0–17.0)
MCH: 29.7 pg (ref 26.0–34.0)
MCH: 29.4 pg (ref 26.0–34.0)
MCV: 83.9 fL (ref 78.0–100.0)
Platelets: 457 10*3/uL — ABNORMAL HIGH (ref 150–400)
Platelets: 451 10*3/uL — ABNORMAL HIGH (ref 150–400)
RBC: 2.36 MIL/uL — ABNORMAL LOW (ref 4.22–5.81)
RBC: 2.11 MIL/uL — ABNORMAL LOW (ref 4.22–5.81)
RDW: 14.3 % (ref 11.5–15.5)
WBC: 30.1 10*3/uL — ABNORMAL HIGH (ref 4.0–10.5)
WBC: 25.7 10*3/uL — ABNORMAL HIGH (ref 4.0–10.5)

## 2012-07-31 LAB — CHOLESTEROL, BODY FLUID: Cholesterol, Fluid: 54 mg/dL

## 2012-07-31 LAB — GLUCOSE, CAPILLARY: Glucose-Capillary: 113 mg/dL — ABNORMAL HIGH (ref 70–99)

## 2012-07-31 LAB — BASIC METABOLIC PANEL
Calcium: 8.2 mg/dL — ABNORMAL LOW (ref 8.4–10.5)
GFR calc non Af Amer: 61 mL/min — ABNORMAL LOW (ref >90)
Sodium: 136 mEq/L (ref 135–145)

## 2012-07-31 LAB — BODY FLUID CELL COUNT WITH DIFFERENTIAL: Total Nucleated Cell Count, Fluid: 652 cu mm (ref 0–1000)

## 2012-07-31 LAB — HEMATOCRIT, BODY FLUID: Hematocrit, Fluid: 2 %

## 2012-07-31 LAB — PROTEIN, BODY FLUID

## 2012-07-31 MED ORDER — TAMSULOSIN HCL 0.4 MG PO CAPS
0.4000 mg | ORAL_CAPSULE | Freq: Every day | ORAL | Status: DC
  Administered 2012-07-31 – 2012-08-09 (×10): 0.4 mg via ORAL
  Filled 2012-07-31 (×10): qty 1

## 2012-07-31 MED ORDER — KCL IN DEXTROSE-NACL 20-5-0.45 MEQ/L-%-% IV SOLN
INTRAVENOUS | Status: DC
  Administered 2012-07-31 – 2012-08-01 (×2): via INTRAVENOUS
  Administered 2012-08-01 – 2012-08-02 (×3): 125 mL/h via INTRAVENOUS
  Administered 2012-08-03 – 2012-08-08 (×18): via INTRAVENOUS
  Filled 2012-07-31 (×32): qty 1000

## 2012-07-31 MED ORDER — OXYBUTYNIN CHLORIDE 5 MG PO TABS
5.0000 mg | ORAL_TABLET | Freq: Two times a day (BID) | ORAL | Status: DC | PRN
  Administered 2012-07-31 – 2012-08-08 (×13): 5 mg via ORAL
  Filled 2012-07-31 (×11): qty 1

## 2012-07-31 MED ORDER — POTASSIUM CHLORIDE 10 MEQ/100ML IV SOLN
10.0000 meq | INTRAVENOUS | Status: AC
  Administered 2012-07-31 (×3): 10 meq via INTRAVENOUS
  Filled 2012-07-31 (×3): qty 100

## 2012-07-31 NOTE — Progress Notes (Signed)
Patient ID: Jared Mendez, male   DOB: [birth year], 41 y.o.   MRN: [id number] 8 Days Post-Op Subjective: Patient reports complains of penile burning and discomfort from the catheter. He is otherwise feeling better. Urine is a very light tea-colored. Apparently a few clots were irrigated and I suspect were old clots. Labs from this morning are pending. Chest CT showed a simple pleural effusion.  Objective: Vital signs in last 24 hours: Temp:  [97.6 F (36.4 C)-101.1 F (38.4 C)] 99 F (37.2 C) (10/01 4098) Pulse Rate:  [107-113] 107  (10/01 0638) Resp:  [18] 18  (10/01 0638) BP: (122-129)/(72-78) 129/78 mmHg (10/01 0638) SpO2:  [93 %-100 %] 93 % (10/01 1191)  Intake/Output from previous day: 09/30 0701 - 10/01 0700 In: 3726.7 [P.O.:720; I.V.:2706.7; IV Piggyback:300] Out: 3365 [Urine:3365] Intake/Output this shift:    Physical Exam:  Constitutional: Vital signs reviewed. WD WN in NAD   Eyes: PERRL, No scleral icterus.   Cardiovascular: RRR Pulmonary/Chest: Normal effort Abdominal: Soft. Non-tender, non-distended, bowel sounds are normal, no masses, organomegaly, or guarding present.  Genitourinary: Foley catheter draining light tea-colored urine. Extremities: No cyanosis or edema   Lab Results:  Basename 07/30/12 0410 07/29/12 0521  HGB 8.1* 7.5*  HCT 23.3* 21.0*   BMET  Basename 07/30/12 0410 07/29/12 0521  NA 136 133*  K 3.5 3.4*  CL 102 101  CO2 23 22  GLUCOSE 106* 124*  BUN 10 9  CREATININE 1.50* 1.45*  CALCIUM 8.6 8.2*   No results found for this basename: LABPT:3,INR:3 in the last 72 hours No results found for this basename: LABURIN:1 in the last 72 hours Results for orders placed during the hospital encounter of 07/23/12  URINE CULTURE     Status: Normal   Collection Time   07/26/12  2:50 PM      Component Value Range Status Comment   Specimen Description URINE, RANDOM   Final    Special Requests NONE   Final    Culture  Setup Time 07/27/2012 01:08    Final    Colony Count NO GROWTH   Final    Culture NO GROWTH   Final    Report Status 07/27/2012 FINAL   Final   URINE CULTURE     Status: Normal   Collection Time   07/28/12  9:43 AM      Component Value Range Status Comment   Specimen Description URINE, CLEAN CATCH   Final    Special Requests NONE   Final    Culture  Setup Time 07/28/2012 17:27   Final    Colony Count NO GROWTH   Final    Culture NO GROWTH   Final    Report Status 07/29/2012 FINAL   Final   CULTURE, BLOOD (ROUTINE X 2)     Status: Normal (Preliminary result)   Collection Time   07/29/12 10:46 AM      Component Value Range Status Comment   Specimen Description BLOOD LEFT ARM   Final    Special Requests BOTTLES DRAWN AEROBIC AND ANAEROBIC 5 CC EACH   Final    Culture  Setup Time 07/29/2012 14:00   Final    Culture     Final    Value:        BLOOD CULTURE RECEIVED NO GROWTH TO DATE CULTURE WILL BE HELD FOR 5 DAYS BEFORE ISSUING A FINAL NEGATIVE REPORT   Report Status PENDING   Incomplete   CULTURE, BLOOD (ROUTINE X 2)  Status: Normal (Preliminary result)   Collection Time   07/29/12 10:50 AM      Component Value Range Status Comment   Specimen Description BLOOD LEFT HAND   Final    Special Requests BOTTLES DRAWN AEROBIC AND ANAEROBIC 5 CC EACH   Final    Culture  Setup Time 07/29/2012 14:00   Final    Culture     Final    Value:        BLOOD CULTURE RECEIVED NO GROWTH TO DATE CULTURE WILL BE HELD FOR 5 DAYS BEFORE ISSUING A FINAL NEGATIVE REPORT   Report Status PENDING   Incomplete   CULTURE, EXPECTORATED SPUTUM-ASSESSMENT     Status: Normal   Collection Time   07/29/12 12:29 PM      Component Value Range Status Comment   Specimen Description SPUTUM   Final    Special Requests NONE   Final    Sputum evaluation     Final    Value: THIS SPECIMEN IS ACCEPTABLE. RESPIRATORY CULTURE REPORT TO FOLLOW.   Report Status 07/29/2012 FINAL   Final   CULTURE, RESPIRATORY     Status: Normal (Preliminary result)    Collection Time   07/29/12 12:29 PM      Component Value Range Status Comment   Specimen Description SPUTUM   Final    Special Requests NONE   Final    Gram Stain     Final    Value: NO WBC SEEN     NO SQUAMOUS EPITHELIAL CELLS SEEN     RARE GRAM POSITIVE COCCI     IN PAIRS RARE GRAM NEGATIVE RODS     RARE GRAM POSITIVE RODS   Culture Culture reincubated for better growth   Final    Report Status PENDING   Incomplete     Studies/Results: Dg Chest 2 View  07/29/2012  *RADIOLOGY REPORT*  Clinical Data: Shortness of breath.  Weakness and fever.  CHEST - 2 VIEW  Comparison: 1 day prior  Findings: Surgical clips in the left upper abdomen.  Borderline cardiomegaly.  Probable small right pleural effusion, new or increased. No pneumothorax.  Perihilar interstitial and right greater than left alveolar opacities are progressive.  IMPRESSION: Worsened right greater than left interstitial and alveolar opacities.  Favor pulmonary edema.  Given the history of weakness and fever, multifocal infection cannot be excluded.  New or enlarging right-sided pleural effusion.   Original Report Authenticated By: Consuello Bossier, M.D.    Ct Chest Wo Contrast  07/30/2012  *RADIOLOGY REPORT*  Clinical Data: SIRS, sepsis and evidence of possible loculated right pleural effusion by chest x-ray.  CT CHEST WITHOUT CONTRAST  Technique:  Multidetector CT imaging of the chest was performed following the standard protocol without IV contrast.  Comparison: Chest x-ray dated 09/29/2013and CT of the abdomen dated 07/26/2012.  Findings: Small to moderate-sized right pleural effusion appears simple and non loculated by CT with fluid density consistent with simple fluid.  The fluid is dependent and does not appear loculated.  There is no obvious pleural thickening by unenhanced CT.  Airspace consolidation and atelectasis is noted in the right lower lobe.  A small dependent left pleural effusion is present with atelectasis present at the  posterior left lung base.  There is no evidence of pulmonary nodule, enlarged lymph nodes or pericardial fluid.  The visualized airway shows normal patency.  Visualized upper abdomen shows focal perinephric hemorrhage posterior to the right kidney presumably related to the recent right nephrolithotomy  procedure.  This appears to be decreasing in size and resolving compared to the most recent abdominal CT.  IMPRESSION: Non-loculated and simple appearing small to moderate right pleural effusion and small left pleural effusion as above.  Airspace consolidation and atelectasis noted in both lower lobes, right greater than left.   Original Report Authenticated By: Reola Calkins, M.D.     Assessment/Plan:   Patient has continued to be intermittently febrile with a leukocytosis. There is not been a definitive infection noted. If this does not improve then repeat CT imaging of the abdomen will be necessary to be sure he has not developed an infected hematoma/abscess from the perinephric bleed and possible urinoma. Repeat labs this morning. I will go ahead and discontinue his Foley catheter and hopefully he'll be able to void out any additional small clots.   LOS: 8 days   Serenidy Waltz S 07/31/2012, 7:59 AM

## 2012-07-31 NOTE — Progress Notes (Signed)
Called Dr Noralee Chars nurse and she made him aware of foley being replaced today.  Urine is still dark pink to red with occasional spasms.  Flomax to be ordered per MD
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
## 2012-07-31 NOTE — Progress Notes (Signed)
Pt having complaints of stomach feeling tight. Irrigated foley twice per pt's request with 60cc of NS. One small clot returned during irrigation. Pink colored urine draining at this time. 600cc of tea colored urine emptied from foley drainage bag prior to irrigation, no clots noted in bag.

## 2012-07-31 NOTE — Progress Notes (Signed)
CRITICAL VALUE ALERT  Critical value received: hgb 6.2   Date of notification:  07/31/12  Time of notification:  1430  Critical value read back:yes  Nurse who received alert:  b Gusta Marksberry   MD notified (1st page):  Tanja Port NP  Time of first page:  1500  MD notified (2nd page):  Time of second page:  Responding MD:  Tanja Port  Time MD responded:  [phone]

## 2012-07-31 NOTE — Progress Notes (Signed)
Pt unable to void after foley removed, bladder scan shows , foley placed per protocol and returned..dark red urine.

## 2012-07-31 NOTE — Progress Notes (Signed)
Pt still having complaints that his stomach feels tight and "something is not right". Pt bladder scanned and 0ml was shown on the scanner. Foley currently draining clear, tea colored urine. Catheter irrigated per pt's request with 60cc of NS and no clots were observed. Foley catheter draining at this time without any difficulty.

## 2012-07-31 NOTE — Procedures (Signed)
Thoracentesis Procedure Note  Pre-operative Diagnosis: effusion  Post-operative Diagnosis: exudative/ bloody effusion   Indications: eval of effusion   Procedure Details  Consent: Informed consent was obtained. Risks of the procedure were discussed including: infection, bleeding, pain, pneumothorax.  Under sterile conditions the patient was positioned. Betadine solution and sterile drapes were utilized.  2% plain plain was used to anesthetize the  rib space. Fluid was obtained without any difficulties and minimal blood loss.  A dressing was applied to the wound and wound care instructions were provided.   Findings 80 ml of bloody pleural fluid was obtained. A sample was sent to Pathology for cytogenetics, flow, and cell counts, as well as for infection analysis.  Complications:  None; patient tolerated the procedure well.          Condition: stable  Plan A follow up chest x-ray was ordered. Bed Rest for 0 hours. Tylenol 650 mg. for pain.  Attending Attestation: I was present and scrubbed for the entire procedure. We did not expect to find bloody fluid.  Will follow up full results of thoracentesis labs and monitor for bleeding.  Yolonda Kida PCCM Pager: [phone] Cell: [phone] If no response, call [phone]

## 2012-07-31 NOTE — Progress Notes (Signed)
Correction of last note.. urine return in foley.

## 2012-08-01 ENCOUNTER — Inpatient Hospital Stay (HOSPITAL_COMMUNITY): Payer: MEDICAID

## 2012-08-01 LAB — CBC
Hemoglobin: 6.4 g/dL — CL (ref 13.0–17.0)
MCH: 29.6 pg (ref 26.0–34.0)
MCV: 84.5 fL (ref 78.0–100.0)
Platelets: 378 10*3/uL (ref 150–400)
RBC: 2.13 MIL/uL — ABNORMAL LOW (ref 4.22–5.81)

## 2012-08-01 LAB — BASIC METABOLIC PANEL
CO2: 23 mEq/L (ref 19–32)
Calcium: 7.7 mg/dL — ABNORMAL LOW (ref 8.4–10.5)
Glucose, Bld: 109 mg/dL — ABNORMAL HIGH (ref 70–99)
Sodium: 134 mEq/L — ABNORMAL LOW (ref 135–145)

## 2012-08-01 LAB — HEMOGLOBIN AND HEMATOCRIT, BLOOD: Hemoglobin: 7.3 g/dL — ABNORMAL LOW (ref 13.0–17.0)

## 2012-08-01 LAB — GLUCOSE, CAPILLARY
Glucose-Capillary: 136 mg/dL — ABNORMAL HIGH (ref 70–99)
Glucose-Capillary: 129 mg/dL — ABNORMAL HIGH (ref 70–99)
Glucose-Capillary: 119 mg/dL — ABNORMAL HIGH (ref 70–99)

## 2012-08-01 NOTE — Progress Notes (Signed)
Pt c/o of excruciating pain in abd, and noted that urine had gotten darken and more clots notable. Attempted to hand irrigate pt and was unable to retrieve all of irrigation and pt is irate and wanting to see the MD. Paged on call Np first to give an update on situation then told by NP to call MD to come assess pt. Dr. Marcello Fennel arrived and placed 3-way coude cath on pt connected to CBI. Pt now states he is more comfortable. Will continue to monitor pt.
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
## 2012-08-01 NOTE — Progress Notes (Signed)
Patient ID: Jared Mendez, male   DOB: 01-11-1971, 41 y.o.   MRN: [id number] 9 Days Post-Op Subjective: Patient reports frustration with his ongoing situation. He has some mild pleuritic chest pain. He reports ongoing nausea but no emesis. He is having minimal flank discomfort. The patient required a new Foley catheter insertion in the early hours of the morning. He has not had any clots since then and his urine has been a fairly light pink color. His hemoglobin again has dropped. There was appeared to time where he did not appear to have any ongoing hematuria or bleeding but he does appear to be having more bleeding that kidney at this time. Creatinine is again improved. He is now been afebrile the last 24 hours.  Objective: Vital signs in last 24 hours: Temp:  [98.5 F (36.9 C)-99.4 F (37.4 C)] 99 F (37.2 C) (10/02 0453) Pulse Rate:  [103-114] 107  (10/02 0453) Resp:  [16-20] 18  (10/02 0453) BP: (108-131)/(59-78) 110/68 mmHg (10/02 0453) SpO2:  [95 %-100 %] 95 % (10/02 0453)  Intake/Output from previous day: 10/01 0701 - 10/02 0700 In: 7330.8 [P.O.:1280; I.V.:1918.8; Blood:352.1; IV Piggyback:300] Out: 9475 [Urine:9475] Intake/Output this shift: Total I/O In: 6000 [Other:6000] Out: 4850 [Urine:4850]  Physical Exam:  Constitutional: Vital signs reviewed. WD WN in NAD   Eyes: PERRL, No scleral icterus.   Cardiovascular: RRR Pulmonary/Chest: Normal effort Abdominal: Soft. Non-tender, slight distention, no masses, organomegaly, or guarding present.  Genitourinary: Foley catheter draining light pink urine. Extremities: No cyanosis or edema   Lab Results:  Basename 08/01/12 0430 07/31/12 1411 07/31/12 0820  HGB 6.4* 6.2* 7.0*  HCT 18.1* 17.7* 19.8*   BMET  Basename 08/01/12 0430 07/31/12 0820  NA 134* 136  K 3.3* 2.9*  CL 103 103  CO2 23 24  GLUCOSE 109* 122*  BUN 7 9  CREATININE 1.34 1.41*  CALCIUM 7.7* 8.2*   No results found for this basename: LABPT:3,INR:3 in the  last 72 hours No results found for this basename: LABURIN:1 in the last 72 hours Results for orders placed during the hospital encounter of 07/23/12  URINE CULTURE     Status: Normal   Collection Time   07/26/12  2:50 PM      Component Value Range Status Comment   Specimen Description URINE, RANDOM   Final    Special Requests NONE   Final    Culture  Setup Time 07/27/2012 01:08   Final    Colony Count NO GROWTH   Final    Culture NO GROWTH   Final    Report Status 07/27/2012 FINAL   Final   URINE CULTURE     Status: Normal   Collection Time   07/28/12  9:43 AM      Component Value Range Status Comment   Specimen Description URINE, CLEAN CATCH   Final    Special Requests NONE   Final    Culture  Setup Time 07/28/2012 17:27   Final    Colony Count NO GROWTH   Final    Culture NO GROWTH   Final    Report Status 07/29/2012 FINAL   Final   CULTURE, BLOOD (ROUTINE X 2)     Status: Normal (Preliminary result)   Collection Time   07/29/12 10:46 AM      Component Value Range Status Comment   Specimen Description BLOOD LEFT ARM   Final    Special Requests BOTTLES DRAWN AEROBIC AND ANAEROBIC 5 CC EACH  Final    Culture  Setup Time 07/29/2012 14:00   Final    Culture     Final    Value:        BLOOD CULTURE RECEIVED NO GROWTH TO DATE CULTURE WILL BE HELD FOR 5 DAYS BEFORE ISSUING A FINAL NEGATIVE REPORT   Report Status PENDING   Incomplete   CULTURE, BLOOD (ROUTINE X 2)     Status: Normal (Preliminary result)   Collection Time   07/29/12 10:50 AM      Component Value Range Status Comment   Specimen Description BLOOD LEFT HAND   Final    Special Requests BOTTLES DRAWN AEROBIC AND ANAEROBIC 5 CC EACH   Final    Culture  Setup Time 07/29/2012 14:00   Final    Culture     Final    Value:        BLOOD CULTURE RECEIVED NO GROWTH TO DATE CULTURE WILL BE HELD FOR 5 DAYS BEFORE ISSUING A FINAL NEGATIVE REPORT   Report Status PENDING   Incomplete   CULTURE, EXPECTORATED SPUTUM-ASSESSMENT      Status: Normal   Collection Time   07/29/12 12:29 PM      Component Value Range Status Comment   Specimen Description SPUTUM   Final    Special Requests NONE   Final    Sputum evaluation     Final    Value: THIS SPECIMEN IS ACCEPTABLE. RESPIRATORY CULTURE REPORT TO FOLLOW.   Report Status 07/29/2012 FINAL   Final   CULTURE, RESPIRATORY     Status: Normal   Collection Time   07/29/12 12:29 PM      Component Value Range Status Comment   Specimen Description SPUTUM   Final    Special Requests NONE   Final    Gram Stain     Final    Value: NO WBC SEEN     NO SQUAMOUS EPITHELIAL CELLS SEEN     RARE GRAM POSITIVE COCCI     IN PAIRS RARE GRAM NEGATIVE RODS     RARE GRAM POSITIVE RODS   Culture NORMAL OROPHARYNGEAL FLORA   Final    Report Status 07/31/2012 FINAL   Final   BODY FLUID CULTURE     Status: Normal (Preliminary result)   Collection Time   07/31/12 10:57 AM      Component Value Range Status Comment   Specimen Description PLEURAL   Final    Special Requests Normal   Final    Gram Stain     Final    Value: RARE WBC PRESENT, PREDOMINANTLY PMN     NO ORGANISMS SEEN   Culture PENDING   Incomplete    Report Status PENDING   Incomplete     Studies/Results: Ct Chest Wo Contrast  07/30/2012  *RADIOLOGY REPORT*  Clinical Data: SIRS, sepsis and evidence of possible loculated right pleural effusion by chest x-ray.  CT CHEST WITHOUT CONTRAST  Technique:  Multidetector CT imaging of the chest was performed following the standard protocol without IV contrast.  Comparison: Chest x-ray dated 09/29/2013and CT of the abdomen dated 07/26/2012.  Findings: Small to moderate-sized right pleural effusion appears simple and non loculated by CT with fluid density consistent with simple fluid.  The fluid is dependent and does not appear loculated.  There is no obvious pleural thickening by unenhanced CT.  Airspace consolidation and atelectasis is noted in the right lower lobe.  A small dependent left  pleural effusion is present with atelectasis  present at the posterior left lung base.  There is no evidence of pulmonary nodule, enlarged lymph nodes or pericardial fluid.  The visualized airway shows normal patency.  Visualized upper abdomen shows focal perinephric hemorrhage posterior to the right kidney presumably related to the recent right nephrolithotomy procedure.  This appears to be decreasing in size and resolving compared to the most recent abdominal CT.  IMPRESSION: Non-loculated and simple appearing small to moderate right pleural effusion and small left pleural effusion as above.  Airspace consolidation and atelectasis noted in both lower lobes, right greater than left.   Original Report Authenticated By: Reola Calkins, M.D.    Dg Chest Port 1 View  07/31/2012  *RADIOLOGY REPORT*  Clinical Data: Post thoracentesis  PORTABLE CHEST - 1 VIEW  Comparison: 07/30/2012  Findings: Cardiomediastinal silhouette is stable.  There is small residual left pleural effusion with left basilar atelectasis or infiltrate.  No diagnostic pneumothorax.  IMPRESSION: Small residual left pleural effusion with left basilar atelectasis or infiltrate.  No diagnostic pneumothorax.   Original Report Authenticated By: Natasha Mead, M.D.     Assessment/Plan:   Patient has evidence of ongoing gross hematuria and bleeding from his kidney. He is stable but should have 2 units of packed red blood cells transfused today. Creatinine is slowly improving again and white blood cell count is coming down. He has been afebrile which is encouraging. 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 There was approximately 48 hour timeframe where there was no evidence of ongoing bleeding and his hemoglobin actually increased and urine was clear. Hopefully this again will  stop in the short-term. Would continue antibiotics at this time. Followup we'll pleural effusion per pulmonary/critical care. I was surprised to hear that the pleural effusion was bloody. Etiology for the bloody effusion of unclear.   LOS: 9 days   Shana Younge S 08/01/2012, 9:14 AM
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
## 2012-08-01 NOTE — Procedures (Signed)
US guided rt thoracentesis 400 cc bloody fluid obtained  Pt tolerated well cxr pending

## 2012-08-01 NOTE — Progress Notes (Signed)
Pt complaining of severe pain and pressure in lower abd, noted pt urine to be dark red with several clots in foley bag. Medicated pt and hand irrigated pt 3 times and was unable to retrieve all of irrigation, notified NP on call. Orders given to take out 5french foley and place 30french foley and hand irrigate until urine free of clots. Also to notify Dr. Marcello Fennel if any more issues occurred throughout the night and MD would come assess pt. Will continue to monitor pt.

## 2012-08-02 ENCOUNTER — Inpatient Hospital Stay (HOSPITAL_COMMUNITY): Payer: MEDICAID

## 2012-08-02 LAB — BASIC METABOLIC PANEL
CO2: 24 mEq/L (ref 19–32)
Chloride: 105 mEq/L (ref 96–112)
Sodium: 135 mEq/L (ref 135–145)

## 2012-08-02 LAB — CBC
Hemoglobin: 6.8 g/dL — CL (ref 13.0–17.0)
MCV: 85.1 fL (ref 78.0–100.0)
Platelets: 410 10*3/uL — ABNORMAL HIGH (ref 150–400)
RBC: 2.21 MIL/uL — ABNORMAL LOW (ref 4.22–5.81)
WBC: 22.9 10*3/uL — ABNORMAL HIGH (ref 4.0–10.5)

## 2012-08-02 LAB — GLUCOSE, CAPILLARY
Glucose-Capillary: 125 mg/dL — ABNORMAL HIGH (ref 70–99)
Glucose-Capillary: 119 mg/dL — ABNORMAL HIGH (ref 70–99)

## 2012-08-02 LAB — HEMOGLOBIN AND HEMATOCRIT, BLOOD: Hemoglobin: 7.9 g/dL — ABNORMAL LOW (ref 13.0–17.0)

## 2012-08-02 MED ORDER — FENTANYL CITRATE 0.05 MG/ML IJ SOLN
INTRAMUSCULAR | Status: AC
  Filled 2012-08-02: qty 6

## 2012-08-02 MED ORDER — IOHEXOL 300 MG/ML  SOLN: 65.0000 mL | Freq: Once | INTRAMUSCULAR | Status: AC | PRN

## 2012-08-02 MED ORDER — MIDAZOLAM HCL 2 MG/2ML IJ SOLN
INTRAMUSCULAR | Status: AC
  Filled 2012-08-02: qty 6

## 2012-08-02 MED ORDER — MIDAZOLAM HCL 5 MG/5ML IJ SOLN
INTRAMUSCULAR | Status: AC | PRN
  Administered 2012-08-02: 2 mg via INTRAVENOUS
  Administered 2012-08-02: 1 mg via INTRAVENOUS

## 2012-08-02 MED ORDER — FENTANYL CITRATE 0.05 MG/ML IJ SOLN
INTRAMUSCULAR | Status: AC | PRN
  Administered 2012-08-02: 100 ug via INTRAVENOUS
  Administered 2012-08-02: 50 ug via INTRAVENOUS

## 2012-08-02 MED ORDER — LIDOCAINE HCL 1 % IJ SOLN
INTRAMUSCULAR | Status: AC
  Filled 2012-08-02: qty 20

## 2012-08-02 NOTE — Progress Notes (Signed)
eLink Physician-Brief Progress Note Patient Name: Jared Mendez DOB: 10-26-1971 MRN: [id number]  Date of Service  08/02/2012   HPI/Events of Note  S/P 2unis of PRBC yesterday with increase in Hgb to 7.3.  Now Hgb of 6.8.  Remains on CBI for ongoing hematuria   eICU Interventions  Plan: Transfuse 1 unit of pRBC Post-txf CBC   Intervention Category Intermediate Interventions: Bleeding - evaluation and treatment with blood products  DETERDING,ELIZABETH 08/02/2012, 5:14 AM

## 2012-08-02 NOTE — ED Notes (Signed)
5Fr Arterial sheath removed from R Fem Artery by Dr. Lowella Dandy.  Exoseal closure device used.  Groin level 0, 2+ RDP.

## 2012-08-02 NOTE — Progress Notes (Signed)
Kept CBI wide open all shift, hand irrigated pt 3-4 times throughout the night. Was able to pull out several large sized clots. Clots were also coming out of penis around catheter throughout shift. Overall pt tolerated irrigation well, continued to make pt comfortable by providing pain medication and antispasmodics when need/avaliable. Will continue to monitor pt.

## 2012-08-02 NOTE — Progress Notes (Signed)
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 4.22 - 5.81 MIL/uL   Hemoglobin 7.8 (*) 13.0 - 17.0 g/dL   HCT 53.6 (*) 64.4 - 03.4 %   MCV 85.0  78.0 - 100.0 fL   MCH 30.8  26.0 - 34.0 pg   MCHC 36.3 (*) 30.0 - 36.0 g/dL   RDW 74.2  59.5 - 63.8 %   Platelets 193  150 - 400 K/uL  BASIC METABOLIC PANEL      Component Value Range   Sodium 130 (*) 135 - 145 mEq/L   Potassium 4.4  3.5 - 5.1 mEq/L   Chloride 100  96 - 112 mEq/L   CO2 20  19 - 32 mEq/L   Glucose, Bld 115 (*) 70 - 99 mg/dL   BUN 22  6 - 23 mg/dL   Creatinine, Ser 7.56 (*) 0.50 - 1.35 mg/dL   Calcium 7.6 (*) 8.4 - 10.5 mg/dL   GFR calc non Af Amer 43 (*) >90 mL/min  GFR calc Af Amer 50 (*) >90 mL/min  PREPARE RBC (CROSSMATCH)      Component Value Range   Order Confirmation ORDER PROCESSED BY BLOOD BANK    TYPE AND SCREEN      Component Value Range   ABO/RH(D) O POS     Antibody Screen NEG     Sample Expiration 07/28/2012     Unit Number [id number]     Blood Component Type RED CELLS,LR     Unit division 00     Status of Unit REL FROM [facility]     Transfusion Status OK TO TRANSFUSE     Crossmatch Result Compatible     Unit Number [id number]     Blood Component Type RED CELLS,LR     Unit division 00     Status of Unit REL FROM [facility]     Transfusion Status OK TO TRANSFUSE     Crossmatch Result Compatible     Unit Number  [id number]     Blood Component Type RED CELLS,LR     Unit division 00     Status of Unit ISSUED,FINAL     Transfusion Status OK TO TRANSFUSE     Crossmatch Result Compatible     Unit Number [id number]     Blood Component Type RED CELLS,LR     Unit division 00     Status of Unit ISSUED,FINAL     Transfusion Status OK TO TRANSFUSE     Crossmatch Result Compatible    TYPE AND SCREEN      Component Value Range   ABO/RH(D) O POS     Antibody Screen NEG     Sample Expiration 07/24/2012     Unit Number [id number]     Blood Component Type RED CELLS,LR     Unit division 00     Status of Unit ISS'D ANOTH PT     Transfusion Status OK TO TRANSFUSE     Crossmatch Result Compatible     Unit Number [id number]     Blood Component Type RED CELLS,LR     Unit division 00     Status of Unit ISS'D ANOTH PT     Transfusion Status OK TO TRANSFUSE     Crossmatch Result Compatible    ABO/RH      Component Value Range   ABO/RH(D) O POS    GLUCOSE, CAPILLARY      Component Value Range   Glucose-Capillary 250 (*) 70 - 99 mg/dL  CBC      Component Value Range   WBC 13.5 (*) 4.0 - 10.5 K/uL   RBC 1.91 (*) 4.22 - 5.81 MIL/uL   Hemoglobin 5.7 (*) 13.0 - 17.0 g/dL   HCT 60.6 (*) 30.1 - 60.1 %   MCV 85.9  78.0 - 100.0 fL   MCH 30.4  26.0 - 34.0 pg   MCHC 35.4  30.0 - 36.0 g/dL   RDW 09.3  23.5 - 57.3 %   Platelets 152  150 - 400 K/uL  BASIC METABOLIC PANEL      Component Value Range   Sodium 130 (*) 135 - 145 mEq/L   Potassium 3.9  3.5 - 5.1 mEq/L   Chloride 104  96 - 112 mEq/L   CO2 20  19 - 32 mEq/L   Glucose, Bld 124 (*) 70 - 99 mg/dL   BUN 17  6 - 23 mg/dL   Creatinine, Ser 2.20 (*) 0.50 - 1.35 mg/dL   Calcium 7.0 (*) 8.4 -  10.5 mg/dL   GFR calc non Af Amer 41 (*) >90 mL/min   GFR calc Af Amer 48 (*) >90 mL/min  APTT      Component Value Range   aPTT 47 (*) 24 - 37 seconds  PROTIME-INR      Component Value Range   Prothrombin Time 16.4 (*) 11.6 - 15.2 seconds   INR 1.35   0.00 - 1.49  PROCALCITONIN      Component Value Range   Procalcitonin 1.08    URINE CULTURE      Component Value Range   Specimen Description URINE, RANDOM     Special Requests NONE     Culture  Setup Time 07/27/2012 01:08     Colony Count NO GROWTH     Culture NO GROWTH     Report Status 07/27/2012 FINAL    CBC      Component Value Range   WBC 13.2 (*) 4.0 - 10.5 K/uL   RBC 2.64 (*) 4.22 - 5.81 MIL/uL   Hemoglobin 8.0 (*) 13.0 - 17.0 g/dL   HCT 16.1 (*) 09.6 - 04.5 %   MCV 83.7  78.0 - 100.0 fL   MCH 30.3  26.0 - 34.0 pg   MCHC 36.2 (*) 30.0 - 36.0 g/dL   RDW 40.9  81.1 - 91.4 %   Platelets 158  150 - 400 K/uL  GLUCOSE, CAPILLARY      Component Value Range   Glucose-Capillary 96  70 - 99 mg/dL   Comment 1 Documented in Chart     Comment 2 Notify RN    CBC      Component Value Range   WBC 11.5 (*) 4.0 - 10.5 K/uL   RBC 2.77 (*) 4.22 - 5.81 MIL/uL   Hemoglobin 8.3 (*) 13.0 - 17.0 g/dL   HCT 78.2 (*) 95.6 - 21.3 %   MCV 84.5  78.0 - 100.0 fL   MCH 30.0  26.0 - 34.0 pg   MCHC 35.5  30.0 - 36.0 g/dL   RDW 08.6  57.8 - 46.9 %   Platelets 181  150 - 400 K/uL  BASIC METABOLIC PANEL      Component Value Range   Sodium 137  135 - 145 mEq/L   Potassium 3.1 (*) 3.5 - 5.1 mEq/L   Chloride 108  96 - 112 mEq/L   CO2 21  19 - 32 mEq/L   Glucose, Bld 137 (*) 70 - 99 mg/dL   BUN 9  6 - 23 mg/dL   Creatinine, Ser 6.29  0.50 - 1.35 mg/dL   Calcium 7.8 (*) 8.4 - 10.5 mg/dL   GFR calc non Af Amer 75 (*) >90 mL/min   GFR calc Af Amer 87 (*) >90 mL/min  GLUCOSE, CAPILLARY      Component Value Range   Glucose-Capillary 109 (*) 70 - 99 mg/dL  GLUCOSE, CAPILLARY      Component Value Range   Glucose-Capillary 141 (*) 70 - 99 mg/dL  GLUCOSE, CAPILLARY      Component Value Range   Glucose-Capillary 159 (*) 70 - 99 mg/dL   Comment 1 Documented in Chart     Comment 2 Notify RN    GLUCOSE, CAPILLARY      Component Value Range   Glucose-Capillary 157 (*) 70 - 99 mg/dL   Comment 1  Documented in Chart     Comment 2 Notify RN    CBC      Component Value Range   WBC  15.8 (*) 4.0 - 10.5 K/uL   RBC 2.49 (*) 4.22 - 5.81 MIL/uL   Hemoglobin 7.6 (*) 13.0 - 17.0 g/dL   HCT 16.1 (*) 09.6 - 04.5 %   MCV 84.3  78.0 - 100.0 fL   MCH 30.5  26.0 - 34.0 pg   MCHC 36.2 (*) 30.0 - 36.0 g/dL   RDW 40.9  81.1 - 91.4 %   Platelets 213  150 - 400 K/uL  BASIC METABOLIC PANEL      Component Value Range   Sodium 133 (*) 135 - 145 mEq/L   Potassium 3.1 (*) 3.5 - 5.1 mEq/L   Chloride 102  96 - 112 mEq/L   CO2 20  19 - 32 mEq/L   Glucose, Bld 118 (*) 70 - 99 mg/dL   BUN 7  6 - 23 mg/dL   Creatinine, Ser 7.82  0.50 - 1.35 mg/dL   Calcium 7.9 (*) 8.4 - 10.5 mg/dL   GFR calc non Af Amer 76 (*) >90 mL/min   GFR calc Af Amer 88 (*) >90 mL/min  GLUCOSE, CAPILLARY      Component Value Range   Glucose-Capillary 138 (*) 70 - 99 mg/dL  GLUCOSE, CAPILLARY      Component Value Range   Glucose-Capillary 148 (*) 70 - 99 mg/dL  GLUCOSE, CAPILLARY      Component Value Range   Glucose-Capillary 91  70 - 99 mg/dL  URINE CULTURE      Component Value Range   Specimen Description URINE, CLEAN CATCH     Special Requests NONE     Culture  Setup Time 07/28/2012 17:27     Colony Count NO GROWTH     Culture NO GROWTH     Report Status 07/29/2012 FINAL    GLUCOSE, CAPILLARY      Component Value Range   Glucose-Capillary 95  70 - 99 mg/dL  BASIC METABOLIC PANEL      Component Value Range   Sodium 133 (*) 135 - 145 mEq/L   Potassium 3.4 (*) 3.5 - 5.1 mEq/L   Chloride 101  96 - 112 mEq/L   CO2 22  19 - 32 mEq/L   Glucose, Bld 124 (*) 70 - 99 mg/dL   BUN 9  6 - 23 mg/dL   Creatinine, Ser 9.56 (*) 0.50 - 1.35 mg/dL   Calcium 8.2 (*) 8.4 - 10.5 mg/dL   GFR calc non Af Amer 59 (*) >90 mL/min   GFR calc Af Amer 68 (*) >90 mL/min  CBC      Component Value Range   WBC 23.4 (*) 4.0 - 10.5 K/uL   RBC 2.50 (*) 4.22 - 5.81 MIL/uL   Hemoglobin 7.5 (*) 13.0 - 17.0 g/dL   HCT 21.3 (*) 08.6 - 57.8 %    MCV 84.0  78.0 - 100.0 fL   MCH 30.0  26.0 - 34.0 pg   MCHC 35.7  30.0 - 36.0 g/dL   RDW 46.9  62.9 - 52.8 %   Platelets 284  150 - 400 K/uL  GLUCOSE, CAPILLARY      Component Value Range   Glucose-Capillary 98  70 - 99 mg/dL   Comment 1 Notify RN     Comment 2 Documented in Chart    GLUCOSE, CAPILLARY      Component Value Range   Glucose-Capillary 113 (*) 70 - 99 mg/dL  GLUCOSE, CAPILLARY      Component Value Range   Glucose-Capillary 126 (*) 70 - 99  mg/dL   Comment 1 Notify RN    CULTURE, BLOOD (ROUTINE X 2)      Component Value Range   Specimen Description BLOOD LEFT HAND     Special Requests BOTTLES DRAWN AEROBIC AND ANAEROBIC 5 CC EACH     Culture  Setup Time 07/29/2012 14:00     Culture       Value:        BLOOD CULTURE RECEIVED NO GROWTH TO DATE CULTURE WILL BE HELD FOR 5 DAYS BEFORE ISSUING A FINAL NEGATIVE REPORT   Report Status PENDING    CULTURE, BLOOD (ROUTINE X 2)      Component Value Range   Specimen Description BLOOD LEFT ARM     Special Requests BOTTLES DRAWN AEROBIC AND ANAEROBIC 5 CC EACH     Culture  Setup Time 07/29/2012 14:00     Culture       Value:        BLOOD CULTURE RECEIVED NO GROWTH TO DATE CULTURE WILL BE HELD FOR 5 DAYS BEFORE ISSUING A FINAL NEGATIVE REPORT   Report Status PENDING    CULTURE, EXPECTORATED SPUTUM-ASSESSMENT      Component Value Range   Specimen Description SPUTUM     Special Requests NONE     Sputum evaluation       Value: THIS SPECIMEN IS ACCEPTABLE. RESPIRATORY CULTURE REPORT TO FOLLOW.   Report Status 07/29/2012 FINAL    CULTURE, RESPIRATORY      Component Value Range   Specimen Description SPUTUM     Special Requests NONE     Gram Stain       Value: NO WBC SEEN     NO SQUAMOUS EPITHELIAL CELLS SEEN     RARE GRAM POSITIVE COCCI     IN PAIRS RARE GRAM NEGATIVE RODS     RARE GRAM POSITIVE RODS   Culture NORMAL OROPHARYNGEAL FLORA     Report Status 07/31/2012 FINAL    GLUCOSE, CAPILLARY      Component Value Range    Glucose-Capillary 124 (*) 70 - 99 mg/dL   Comment 1 Notify RN    CBC      Component Value Range   WBC 32.7 (*) 4.0 - 10.5 K/uL   RBC 2.75 (*) 4.22 - 5.81 MIL/uL   Hemoglobin 8.1 (*) 13.0 - 17.0 g/dL   HCT 40.9 (*) 81.1 - 91.4 %   MCV 84.7  78.0 - 100.0 fL   MCH 29.5  26.0 - 34.0 pg   MCHC 34.8  30.0 - 36.0 g/dL   RDW 78.2  95.6 - 21.3 %   Platelets 397  150 - 400 K/uL  BASIC METABOLIC PANEL      Component Value Range   Sodium 136  135 - 145 mEq/L   Potassium 3.5  3.5 - 5.1 mEq/L   Chloride 102  96 - 112 mEq/L   CO2 23  19 - 32 mEq/L   Glucose, Bld 106 (*) 70 - 99 mg/dL   BUN 10  6 - 23 mg/dL   Creatinine, Ser 0.86 (*) 0.50 - 1.35 mg/dL   Calcium 8.6  8.4 - 57.8 mg/dL   GFR calc non Af Amer 57 (*) >90 mL/min   GFR calc Af Amer 66 (*) >90 mL/min  GLUCOSE, CAPILLARY      Component Value Range   Glucose-Capillary 112 (*) 70 - 99 mg/dL   Comment 1 Notify RN    GLUCOSE, CAPILLARY      Component Value  Range   Glucose-Capillary 111 (*) 70 - 99 mg/dL  GLUCOSE, CAPILLARY      Component Value Range   Glucose-Capillary 109 (*) 70 - 99 mg/dL   Comment 1 Notify RN    GLUCOSE, CAPILLARY      Component Value Range   Glucose-Capillary 146 (*) 70 - 99 mg/dL   Comment 1 Notify RN    GLUCOSE, CAPILLARY      Component Value Range   Glucose-Capillary 97  70 - 99 mg/dL   Comment 1 Documented in Chart     Comment 2 Notify RN    GLUCOSE, CAPILLARY      Component Value Range   Glucose-Capillary 108 (*) 70 - 99 mg/dL   Comment 1 Notify RN    BASIC METABOLIC PANEL      Component Value Range   Sodium 136  135 - 145 mEq/L   Potassium 2.9 (*) 3.5 - 5.1 mEq/L   Chloride 103  96 - 112 mEq/L   CO2 24  19 - 32 mEq/L   Glucose, Bld 122 (*) 70 - 99 mg/dL   BUN 9  6 - 23 mg/dL   Creatinine, Ser 9.14 (*) 0.50 - 1.35 mg/dL   Calcium 8.2 (*) 8.4 - 10.5 mg/dL   GFR calc non Af Amer 61 (*) >90 mL/min   GFR calc Af Amer 71 (*) >90 mL/min  CBC      Component Value Range   WBC 30.1 (*) 4.0 - 10.5  K/uL   RBC 2.36 (*) 4.22 - 5.81 MIL/uL   Hemoglobin 7.0 (*) 13.0 - 17.0 g/dL   HCT 78.2 (*) 95.6 - 21.3 %   MCV 83.9  78.0 - 100.0 fL   MCH 29.7  26.0 - 34.0 pg   MCHC 35.4  30.0 - 36.0 g/dL   RDW 08.6  57.8 - 46.9 %   Platelets 457 (*) 150 - 400 K/uL  GLUCOSE, CAPILLARY      Component Value Range   Glucose-Capillary 113 (*) 70 - 99 mg/dL   Comment 1 Notify RN    LACTATE DEHYDROGENASE, BODY FLUID      Component Value Range   LD, Fluid 2443 (*) 3 - 23 U/L   Fluid Type-FLDH Body Fluid    PROTEIN, BODY FLUID      Component Value Range   Total protein, fluid 3.9     Fluid Type-FTP Body Fluid    BODY FLUID CELL COUNT WITH DIFFERENTIAL      Component Value Range   Fluid Type-FCT Body Fluid     Color, Fluid RED (*) YELLOW   Appearance, Fluid CLOUDY (*) CLEAR   WBC, Fluid 652  0 - 1000 cu mm   Neutrophil Count, Fluid 81 (*) 0 - 25 %   Lymphs, Fluid 12     Monocyte-Macrophage-Serous Fluid 7 (*) 50 - 90 %   Eos, Fluid 0     Other Cells, Fluid 0    BODY FLUID CULTURE      Component Value Range   Specimen Description PLEURAL     Special Requests Normal     Gram Stain       Value: RARE WBC PRESENT, PREDOMINANTLY PMN     NO ORGANISMS SEEN   Culture NO GROWTH     Report Status PENDING    CHOLESTEROL, BODY FLUID      Component Value Range   Cholesterol, Fluid 54     Chol, Fluid Type BODY FLUID PLEURAL  LACTATE DEHYDROGENASE      Component Value Range   LDH 434 (*) 94 - 250 U/L  PROTEIN, TOTAL      Component Value Range   Total Protein 5.6 (*) 6.0 - 8.3 g/dL  PH, BODY FLUID      Component Value Range   pH, Fluid Type BODY FLUID PLEURAL     pH, Fluid 8.50    CBC      Component Value Range   WBC 25.7 (*) 4.0 - 10.5 K/uL   RBC 2.11 (*) 4.22 - 5.81 MIL/uL   Hemoglobin 6.2 (*) 13.0 - 17.0 g/dL   HCT 96.0 (*) 45.4 - 09.8 %   MCV 83.9  78.0 - 100.0 fL   MCH 29.4  26.0 - 34.0 pg   MCHC 35.0  30.0 - 36.0 g/dL   RDW 11.9  14.7 - 82.9 %   Platelets 451 (*) 150 - 400 K/uL    HEMATOCRIT, BODY FLUID      Component Value Range   Fluid Type-FLDTYP PLEURAL     Hematocrit, Fluid 2.0    GLUCOSE, CAPILLARY      Component Value Range   Glucose-Capillary 119 (*) 70 - 99 mg/dL   Comment 1 Notify RN    PREPARE RBC (CROSSMATCH)      Component Value Range   Order Confirmation ORDER PROCESSED BY BLOOD BANK    BASIC METABOLIC PANEL      Component Value Range   Sodium 134 (*) 135 - 145 mEq/L   Potassium 3.3 (*) 3.5 - 5.1 mEq/L   Chloride 103  96 - 112 mEq/L   CO2 23  19 - 32 mEq/L   Glucose, Bld 109 (*) 70 - 99 mg/dL   BUN 7  6 - 23 mg/dL   Creatinine, Ser 5.62  0.50 - 1.35 mg/dL   Calcium 7.7 (*) 8.4 - 10.5 mg/dL   GFR calc non Af Amer 65 (*) >90 mL/min   GFR calc Af Amer 75 (*) >90 mL/min  CBC      Component Value Range   WBC 22.5 (*) 4.0 - 10.5 K/uL   RBC 2.13 (*) 4.22 - 5.81 MIL/uL   Hemoglobin 6.4 (*) 13.0 - 17.0 g/dL   HCT 13.0 (*) 86.5 - 78.4 %   MCV 84.5  78.0 - 100.0 fL   MCH 29.6  26.0 - 34.0 pg   MCHC 35.0  30.0 - 36.0 g/dL   RDW 69.6  29.5 - 28.4 %   Platelets 378  150 - 400 K/uL  TYPE AND SCREEN      Component Value Range   ABO/RH(D) O POS     Antibody Screen NEG     Sample Expiration 08/03/2012     Unit Number [id number]     Blood Component Type RBC LR PHER1     Unit division 00     Status of Unit ISSUED,FINAL     Transfusion Status OK TO TRANSFUSE     Crossmatch Result Compatible     Unit Number [id number]     Blood Component Type RED CELLS,LR     Unit division 00     Status of Unit ISSUED,FINAL     Transfusion Status OK TO TRANSFUSE     Crossmatch Result Compatible     Unit Number [id number]     Blood Component Type RED CELLS,LR     Unit division 00     Status of Unit ISSUED     Transfusion Status  OK TO TRANSFUSE     Crossmatch Result Compatible    GLUCOSE, CAPILLARY      Component Value Range   Glucose-Capillary 106 (*) 70 - 99 mg/dL  GLUCOSE, CAPILLARY      Component Value Range   Glucose-Capillary 112 (*) 70 - 99  mg/dL   Comment 1 Documented in Chart     Comment 2 Notify RN    GLUCOSE, CAPILLARY      Component Value Range   Glucose-Capillary 97  70 - 99 mg/dL  PREPARE RBC (CROSSMATCH)      Component Value Range   Order Confirmation ORDER PROCESSED BY BLOOD BANK    GLUCOSE, CAPILLARY      Component Value Range   Glucose-Capillary 136 (*) 70 - 99 mg/dL  HEMOGLOBIN AND HEMATOCRIT, BLOOD      Component Value Range   Hemoglobin 7.3 (*) 13.0 - 17.0 g/dL   HCT 16.1 (*) 09.6 - 04.5 %  CBC      Component Value Range   WBC 22.9 (*) 4.0 - 10.5 K/uL   RBC 2.21 (*) 4.22 - 5.81 MIL/uL   Hemoglobin 6.8 (*) 13.0 - 17.0 g/dL   HCT 40.9 (*) 81.1 - 91.4 %   MCV 85.1  78.0 - 100.0 fL   MCH 30.8  26.0 - 34.0 pg   MCHC 36.2 (*) 30.0 - 36.0 g/dL   RDW 78.2  95.6 - 21.3 %   Platelets 410 (*) 150 - 400 K/uL  BASIC METABOLIC PANEL      Component Value Range   Sodium 135  135 - 145 mEq/L   Potassium 3.8  3.5 - 5.1 mEq/L   Chloride 105  96 - 112 mEq/L   CO2 24  19 - 32 mEq/L   Glucose, Bld 115 (*) 70 - 99 mg/dL   BUN 6  6 - 23 mg/dL   Creatinine, Ser 0.86 (*) 0.50 - 1.35 mg/dL   Calcium 8.0 (*) 8.4 - 10.5 mg/dL   GFR calc non Af Amer 61 (*) >90 mL/min   GFR calc Af Amer 70 (*) >90 mL/min  GLUCOSE, CAPILLARY      Component Value Range   Glucose-Capillary 129 (*) 70 - 99 mg/dL  GLUCOSE, CAPILLARY      Component Value Range   Glucose-Capillary 119 (*) 70 - 99 mg/dL   Comment 1 Notify RN    PREPARE RBC (CROSSMATCH)      Component Value Range   Order Confirmation ORDER PROCESSED BY BLOOD BANK    GLUCOSE, CAPILLARY      Component Value Range   Glucose-Capillary 126 (*) 70 - 99 mg/dL   A/P: Pt with hx right percutaneous nephrolithotomy 9/23 with subsequent perinephric hematoma; now with hematuria/persistent drop in hgb despite transfusions and evidence of enlarging perinephric hematoma on 10/2 CT ; plan is for right renal arteriogram with possible angioplasty/stenting /embolization. Details/risks of  procedure(including, but not limited to, internal bleeding, infection, worsening renal failure requiring HD, need for right nephrectomy) d/w pt/pt's mother with their understanding and consent.
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
## 2012-08-02 NOTE — Progress Notes (Signed)
Foley not draining with CBI. Hand irrigation attempted with no urine return. Pt abd distended and pt c/o of pain in bladder. MD paged, orders received. Jared Mendez  

## 2012-08-02 NOTE — Progress Notes (Signed)
CBI infusing wide open, foley clotted, hand irrigated foley now draining clear pink urine.

## 2012-08-02 NOTE — Procedures (Signed)
Aortogram and right renal angiogram were performed.  No evidence for active bleeding or vascular lesion.  See dictated report.
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
## 2012-08-02 NOTE — Progress Notes (Signed)
CRITICAL VALUE ALERT  Critical value received:  Hgb 6.8  Date of notification:  08/02/2012  Time of notification:  0500  Critical value read back:yes  Nurse who received alert:  H. Earlene Plater  MD notified (1st page):  PCCM  Time of first page:  0509  MD notified (2nd page):  Time of second page:  Responding MD:  Deterding, E.  Time MD responded:  [phone]

## 2012-08-03 ENCOUNTER — Inpatient Hospital Stay (HOSPITAL_COMMUNITY): Payer: MEDICAID

## 2012-08-03 LAB — TYPE AND SCREEN: Unit division: 0

## 2012-08-03 LAB — CBC
HCT: 20.7 % — ABNORMAL LOW (ref 39.0–52.0)
Hemoglobin: 7.4 g/dL — ABNORMAL LOW (ref 13.0–17.0)
MCH: 30.7 pg (ref 26.0–34.0)
MCV: 85.9 fL (ref 78.0–100.0)
RBC: 2.41 MIL/uL — ABNORMAL LOW (ref 4.22–5.81)

## 2012-08-03 LAB — VANCOMYCIN, TROUGH: Vancomycin Tr: 19.5 ug/mL (ref 10.0–20.0)

## 2012-08-03 LAB — BASIC METABOLIC PANEL
BUN: 6 mg/dL (ref 6–23)
CO2: 24 mEq/L (ref 19–32)
Calcium: 7.9 mg/dL — ABNORMAL LOW (ref 8.4–10.5)
Creatinine, Ser: 1.48 mg/dL — ABNORMAL HIGH (ref 0.50–1.35)
Glucose, Bld: 146 mg/dL — ABNORMAL HIGH (ref 70–99)
Sodium: 135 mEq/L (ref 135–145)

## 2012-08-03 LAB — GLUCOSE, CAPILLARY: Glucose-Capillary: 113 mg/dL — ABNORMAL HIGH (ref 70–99)

## 2012-08-03 NOTE — Progress Notes (Signed)
Attempted to insert a 24 FR straight catheter per MD order and met resistance. Unable to advance catheter to the Y and so removed catheter and MD being made aware. Awaiting orders.   Arta Bruce [facility] 08/03/2012

## 2012-08-03 NOTE — Progress Notes (Addendum)
Pt had a 24 Fr Coude Catheter with CBI running wide open. Pt's urine was a very light pink and within a half an hour the urine turned to bright red and pt c/o discomfort. Foley stopped draining at this time, even though CBI was infusing. CBI clamped and attempted to hand irrigate 4 times with 30cc normal saline each time. Unable to get any results or remove blockage with hand irrigation. MD made aware and order given to change catheter. MD stated could use a coude or a straight cath and to call if any problems. After inserting a new 24Fr coude catheter to the Y,  urine was returned but it was draining from the CBI port. Had another RN assess the catheter as well and we determined that the catheter was defected. Foley removed and a new 24Fr catheter is being ordered at this time. Will insert new catheter and resume CBI as soon as foley arrives to floor.   Arta Bruce [facility] 08/03/2012
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
## 2012-08-03 NOTE — Progress Notes (Signed)
Pt c/o bladder discomfort. Pt bladder scanned and found to have in his bladder. Pt attempted to urinate and was able to void about 75cc. Pt baldder scanned again and 421cc found in bladder. MD made aware and orders given to replace foley and to start CBI.

## 2012-08-03 NOTE — Progress Notes (Signed)
Nutrition Follow-up  Intervention:  1. Will continue to provide Resource Breeze BID, provides 500 kcal and 18 grams of protein daily.  2. RD to follow for nutrition plan of care.   Assessment:   Patient's diet has been advanced to a regular diet. Patient reported he has been drinking the resource breeze nutrition supplements. He reported he has been consuming less than 50% of his meals. He reported he has had nausea today. PO intake documented 15% at meals. Per RN patient ate poorly this morning due to nausea.   Diet Order:  Regular   Meds: Scheduled Meds:   . antiseptic oral rinse  15 mL Mouth Rinse q12n4p  . chlorhexidine  15 mL Mouth Rinse BID  . feeding supplement  1 Container Oral BID BM  . fentaNYL      . insulin aspart  0-15 Units Subcutaneous TID WC  . lidocaine      . metoCLOPramide (REGLAN) injection  5 mg Intravenous Q6H  . midazolam      . piperacillin-tazobactam (ZOSYN)  IV  3.375 g Intravenous Q8H  . Tamsulosin HCl  0.4 mg Oral Daily  . vancomycin  750 mg Intravenous Q12H  . DISCONTD: pantoprazole (PROTONIX) IV  40 mg Intravenous Q2200   Continuous Infusions:   . dextrose 5 % and 0.45 % NaCl with KCl 20 mEq/L 125 mL/hr at 08/03/12 0546   PRN Meds:.acetaminophen, chlorproMAZINE, fentaNYL, HYDROcodone-acetaminophen, HYDROmorphone (DILAUDID) injection, iohexol, midazolam, ondansetron (ZOFRAN) IV, opium-belladonna, oxybutynin, promethazine, DISCONTD: acetaminophen  Labs:  CMP     Component Value Date/Time   NA 135 08/03/2012 0426   K 3.8 08/03/2012 0426   CL 103 08/03/2012 0426   CO2 24 08/03/2012 0426   GLUCOSE 146* 08/03/2012 0426   BUN 6 08/03/2012 0426   CREATININE 1.48* 08/03/2012 0426   CALCIUM 7.9* 08/03/2012 0426   PROT 5.6* 07/31/2012 0820   GFRNONAA 58* 08/03/2012 0426   GFRAA 67* 08/03/2012 0426     Intake/Output Summary (Last 24 hours) at 08/03/12 1427 Last data filed at 08/03/12 1110  Gross per 24 hour  Intake 54306.25 ml  Output  96045 ml  Net 1231.25  ml    Weight Status:  140 lb. Weight up 3 lb from weight on 9/26.   Re-estimated needs remain the same: 1800-2100 kcal, 50-63 grams protein   Nutrition Dx:  Inadequate Oral Intake, Ongoing.   Goal:   1. Diet advancements as medically able with positive tolerance. - Continue, diet has been advanced from clear to regular diet.  2. Promote weight maintenance/ prevent weight loss. -Meeting, continue.  3. PO intake > 75% at meals and supplements. -Not yet meeting, continue.    Monitor:  Diet tolerance, PO intake, weights, labs   Adron Bene [phone]
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
## 2012-08-03 NOTE — Progress Notes (Signed)
Pt c/o pain in lower abdomen/bladder. CBI stopped dripping, attempted to irrigate bladder, was able to push fluid in and not out. Bladder scanned pt, showed 350cc in bladder. Notified MD on call, MD came to assess pt and intervened. Will continue to monitor pt.

## 2012-08-03 NOTE — Progress Notes (Signed)
Pt c/o of lower abdominal pain. Attempted to irrigate pt, unable to pull fluid out. Notified MD, orders given to replace catheter. Before pulling catheter out, tried to positioned catheter in a way where a small clot drained out and urine/fluid began to flow from catheter. So there was not a replacement with catheter. Urine is draining light pink and CBI continues to be wide open. Will continue to monitor pt.
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
## 2012-08-04 ENCOUNTER — Other Ambulatory Visit (HOSPITAL_COMMUNITY): Payer: Self-pay

## 2012-08-04 LAB — CULTURE, BLOOD (ROUTINE X 2)
Culture: NO GROWTH
Culture: NO GROWTH

## 2012-08-04 LAB — PREPARE RBC (CROSSMATCH)

## 2012-08-04 LAB — CBC
HCT: 18.9 % — ABNORMAL LOW (ref 39.0–52.0)
Platelets: 474 10*3/uL — ABNORMAL HIGH (ref 150–400)
RDW: 14.2 % (ref 11.5–15.5)
WBC: 22.4 10*3/uL — ABNORMAL HIGH (ref 4.0–10.5)

## 2012-08-04 LAB — GLUCOSE, CAPILLARY
Glucose-Capillary: 122 mg/dL — ABNORMAL HIGH (ref 70–99)
Glucose-Capillary: 125 mg/dL — ABNORMAL HIGH (ref 70–99)
Glucose-Capillary: 178 mg/dL — ABNORMAL HIGH (ref 70–99)

## 2012-08-04 LAB — HEMOGLOBIN AND HEMATOCRIT, BLOOD
HCT: 28.1 % — ABNORMAL LOW (ref 39.0–52.0)
Hemoglobin: 9.7 g/dL — ABNORMAL LOW (ref 13.0–17.0)

## 2012-08-04 LAB — BASIC METABOLIC PANEL
Chloride: 102 mEq/L (ref 96–112)
Creatinine, Ser: 1.61 mg/dL — ABNORMAL HIGH (ref 0.50–1.35)
GFR calc Af Amer: 60 mL/min — ABNORMAL LOW (ref >90)
Sodium: 133 mEq/L — ABNORMAL LOW (ref 135–145)

## 2012-08-04 LAB — BODY FLUID CULTURE: Special Requests: NORMAL

## 2012-08-04 NOTE — Progress Notes (Signed)
12 Days Post-Op Subjective: Patient reports continued flank pain.  Objective: Vital signs in last 24 hours: Temp:  [98.1 F (36.7 C)-98.9 F (37.2 C)] 98.9 F (37.2 C) (10/05 0541) Pulse Rate:  [100-107] 107  (10/05 0541) Resp:  [18-19] 18  (10/05 0541) BP: (114-131)/(74-82) 131/82 mmHg (10/05 0541) SpO2:  [100 %] 100 % (10/05 0541)  Intake/Output from previous day: 10/04 0701 - 10/05 0700 In: 41509.2 [P.O.:480; I.V.:3079.2; IV Piggyback:450] Out: 16109 [Urine:42000] Intake/Output this shift: Total I/O In: 3000 [Other:3000] Out: 7475 [Urine:7475]  Physical Exam:  General: He is awake and alert.  He does have some CVAT on the right. His abdomen is free of peritoneal signs.   Lab Results:  Basename 08/04/12 0526 08/03/12 0426 08/02/12 1349  HGB 6.6* 7.4* 7.9*  HCT 18.9* 20.7* 22.7*   BMET  Basename 08/04/12 0526 08/03/12 0426  NA 133* 135  K 4.0 3.8  CL 102 103  CO2 24 24  GLUCOSE 131* 146*  BUN 6 6  CREATININE 1.61* 1.48*  CALCIUM 8.3* 7.9*   No results found for this basename: LABPT:3,INR:3 in the last 72 hours No results found for this basename: LABURIN:1 in the last 72 hours Results for orders placed during the hospital encounter of 07/23/12  URINE CULTURE     Status: Normal   Collection Time   07/26/12  2:50 PM      Component Value Range Status Comment   Specimen Description URINE, RANDOM   Final    Special Requests NONE   Final    Culture  Setup Time 07/27/2012 01:08   Final    Colony Count NO GROWTH   Final    Culture NO GROWTH   Final    Report Status 07/27/2012 FINAL   Final   URINE CULTURE     Status: Normal   Collection Time   07/28/12  9:43 AM      Component Value Range Status Comment   Specimen Description URINE, CLEAN CATCH   Final    Special Requests NONE   Final    Culture  Setup Time 07/28/2012 17:27   Final    Colony Count NO GROWTH   Final    Culture NO GROWTH   Final    Report Status 07/29/2012 FINAL   Final   CULTURE, BLOOD  (ROUTINE X 2)     Status: Normal (Preliminary result)   Collection Time   07/29/12 10:46 AM      Component Value Range Status Comment   Specimen Description BLOOD LEFT ARM   Final    Special Requests BOTTLES DRAWN AEROBIC AND ANAEROBIC 5 CC EACH   Final    Culture  Setup Time 07/29/2012 14:00   Final    Culture     Final    Value:        BLOOD CULTURE RECEIVED NO GROWTH TO DATE CULTURE WILL BE HELD FOR 5 DAYS BEFORE ISSUING A FINAL NEGATIVE REPORT   Report Status PENDING   Incomplete   CULTURE, BLOOD (ROUTINE X 2)     Status: Normal (Preliminary result)   Collection Time   07/29/12 10:50 AM      Component Value Range Status Comment   Specimen Description BLOOD LEFT HAND   Final    Special Requests BOTTLES DRAWN AEROBIC AND ANAEROBIC 5 CC EACH   Final    Culture  Setup Time 07/29/2012 14:00   Final    Culture     Final    Value:  BLOOD CULTURE RECEIVED NO GROWTH TO DATE CULTURE WILL BE HELD FOR 5 DAYS BEFORE ISSUING A FINAL NEGATIVE REPORT   Report Status PENDING   Incomplete   CULTURE, EXPECTORATED SPUTUM-ASSESSMENT     Status: Normal   Collection Time   07/29/12 12:29 PM      Component Value Range Status Comment   Specimen Description SPUTUM   Final    Special Requests NONE   Final    Sputum evaluation     Final    Value: THIS SPECIMEN IS ACCEPTABLE. RESPIRATORY CULTURE REPORT TO FOLLOW.   Report Status 07/29/2012 FINAL   Final   CULTURE, RESPIRATORY     Status: Normal   Collection Time   07/29/12 12:29 PM      Component Value Range Status Comment   Specimen Description SPUTUM   Final    Special Requests NONE   Final    Gram Stain     Final    Value: NO WBC SEEN     NO SQUAMOUS EPITHELIAL CELLS SEEN     RARE GRAM POSITIVE COCCI     IN PAIRS RARE GRAM NEGATIVE RODS     RARE GRAM POSITIVE RODS   Culture NORMAL OROPHARYNGEAL FLORA   Final    Report Status 07/31/2012 FINAL   Final   BODY FLUID CULTURE     Status: Normal (Preliminary result)   Collection Time   07/31/12  10:57 AM      Component Value Range Status Comment   Specimen Description PLEURAL   Final    Special Requests Normal   Final    Gram Stain     Final    Value: RARE WBC PRESENT, PREDOMINANTLY PMN     NO ORGANISMS SEEN   Culture NO GROWTH 2 DAYS   Final    Report Status PENDING   Incomplete     Studies/Results: Dg Chest 2 View  08/03/2012  *RADIOLOGY REPORT*  Clinical Data: Follow up right effusion  CHEST - 2 VIEW  Comparison: 08/01/2012  Findings: No change in volume of right pleural effusion allowing for differences in technique and position.  Hazy overlying interstitial changes persist on the right.  Left lung remains clear. The heart and mediastinal structures remain normal.  IMPRESSION: No significant interval changes.  Right pleural effusion. Overlying interstitial opacities at the right base.  No pneumothorax.   Original Report Authenticated By: Mervin Hack, M.D.    Ir Angiogram Renal Right Selective  08/02/2012  *RADIOLOGY REPORT*  Clinical history:41 year old with history of a right percutaneous nephrolithotomy procedure for a kidney stone.  The patient has a right perinephric hematoma and hematuria.  Recent CT demonstrated enlargement of the right perinephric hematoma and concern for an underlying arterial lesion or bleeding.  PROCEDURE(S): AORTOGRAM; RIGHT RENAL ARTERIOGRAM; LUMBAR ARTERY INJECTION; ULTRASOUND GUIDANCE FOR VASCULAR ACCESS  Physician: Rachelle Hora. Henn, MD  Medications:Versed of 3 mg, Fentanyl 150 mcg. A radiology nurse monitored the patient for moderate sedation.  Moderate sedation time:60 minutes  Fluoroscopy time: 14.2 minutes  Contrast:  65 ml Omnipaque-300  Procedure:The procedure was explained to the patient.  The risks and benefits of the procedure were discussed and the patient's questions were addressed.  Specifically, the risk of nephrotoxicity from iodinated contrast was discussed with the patient.  Informed consent was obtained from the patient.  The patient  was placed supine on the interventional table.  The patient had palpable right groin pulse and palpable pedal pulses.  The right groin was  prepped and draped in a sterile fashion.  Maximal barrier sterile technique was utilized including caps, mask, sterile gowns, sterile gloves, sterile drape, hand hygiene and skin antiseptic.  Using ultrasound guidance, the right common femoral artery was identified.  The skin was anesthetized with lidocaine.  A 21 gauge needle was directed into the right common femoral artery with ultrasound guidance. Micropuncture dilator set was placed.  A 5-French vascular sheath was placed over a Bentson wire.  A Sos catheter was reconstituted in the thoracic aorta.  The right renal artery was easily selected and a right renal angiogram was performed.  Attempted to cannulate an accessory right renal artery with Sos and C2 catheters.  An Omniflush catheter was placed in the proximal abdominal aorta and an aortogram was performed.  Another attempt to find an accessory right renal artery was performed with a Sos catheter.  A lumbar artery was cannulated in the lower abdominal aorta and an angiogram of this vessel was performed.  The Sos catheter was removed.  The 5-French vascular sheath would no longer aspirate.  As a result, a new 5-French sheath was placed over the [facility] wire. Contrast was injected through the sheath to confirm patency of the right common femoral artery and proximal femoral arteries.  The vascular sheath was removed with an Exoseal closure device.  Findings:Right renal arteriogram:  The right renal artery supplies the upper and mid pole of the right kidney.  There is no evidence for active contrast extravasation and no evidence for a vascular lesion.  However, there is no filling of the lower third of the right kidney.  There may be a standing wave involving the first branch off the right renal artery. A few of the branches have a subtle beaded appearance but the findings  are not clearly representative of fibromuscular dysplasia.  Aortogram:  The abdominal aorta is patent and filling of the common iliac arteries.  There is also filling of the celiac trunk, proximal left renal artery and superior mesenteric artery.  Lumbar vessels are identified in the lower abdominal aorta.  An accessory right renal artery was not clearly identified.  Lumbar artery:  A lower abdominal lumbar artery was cannulated. There is no filling of the inferior right kidney.  Complications: None  Impression:There is no evidence for acute bleeding or a vascular lesion involving the main right renal artery and its branches. However, the inferior third of the right kidney is not clearly identified.  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  An infarction to the right kidney lower pole cannot be excluded.  These findings were discussed with Dr. Isabel Caprice.  The right kidney could be further evaluated with a CTA of the abdomen and pelvis to look for an accessory right renal artery and evaluate the enhancement pattern of the right kidney.   Original Report Authenticated By: Richarda Overlie, M.D.    Ir Angio/spinal Right  08/02/2012  *RADIOLOGY REPORT*  Clinical history:41 year old with history of a right percutaneous nephrolithotomy procedure for a kidney stone.  The patient has a right perinephric hematoma and hematuria.  Recent CT demonstrated enlargement of the right perinephric hematoma and concern for an underlying arterial lesion or bleeding.  PROCEDURE(S): AORTOGRAM; RIGHT RENAL ARTERIOGRAM; LUMBAR ARTERY INJECTION; ULTRASOUND GUIDANCE FOR VASCULAR ACCESS  Physician: Rachelle Hora. Henn, MD  Medications:Versed of 3 mg, Fentanyl 150 mcg. A radiology nurse monitored the patient for moderate sedation.  Moderate sedation time:60 minutes  Fluoroscopy time: 14.2 minutes  Contrast:  65 ml Omnipaque-300  Procedure:The procedure was explained to the patient.  The risks and benefits of the  procedure were discussed and the patient's questions were addressed.  Specifically, the risk of nephrotoxicity from iodinated contrast was discussed with the patient.  Informed consent was obtained from the patient.  The patient was placed supine on the interventional table.  The patient had palpable right groin pulse and palpable pedal pulses.  The right groin was prepped and draped in a sterile fashion.  Maximal barrier sterile technique was utilized including caps, mask, sterile gowns, sterile gloves, sterile drape, hand hygiene and skin antiseptic.  Using ultrasound guidance, the right common femoral artery was identified.  The skin was anesthetized with lidocaine.  A 21 gauge needle was directed into the right common femoral artery with ultrasound guidance. Micropuncture dilator set was placed.  A 5-French vascular sheath was placed over a Bentson wire.  A Sos catheter was reconstituted in the thoracic aorta.  The right renal artery was easily selected and a right renal angiogram was performed.  Attempted to cannulate an accessory right renal artery with Sos and C2 catheters.  An Omniflush catheter was placed in the proximal abdominal aorta and an aortogram was performed.  Another attempt to find an accessory right renal artery was performed with a Sos catheter.  A lumbar artery was cannulated in the lower abdominal aorta and an angiogram of this vessel was performed.  The Sos catheter was removed.  The 5-French vascular sheath would no longer aspirate.  As a result, a new 5-French sheath was placed over the [facility] wire. Contrast was injected through the sheath to confirm patency of the right common femoral artery and proximal femoral arteries.  The vascular sheath was removed with an Exoseal closure device.  Findings:Right renal arteriogram:  The right renal artery supplies the upper and mid pole of the right kidney.  There is no evidence for active contrast extravasation and no evidence for a vascular lesion.   However, there is no filling of the lower third of the right kidney.  There may be a standing wave involving the first branch off the right renal artery. A few of the branches have a subtle beaded appearance but the findings are not clearly representative of fibromuscular dysplasia.  Aortogram:  The abdominal aorta is patent and filling of the common iliac arteries.  There is also filling of the celiac trunk, proximal left renal artery and superior mesenteric artery.  Lumbar vessels are identified in the lower abdominal aorta.  An accessory right renal artery was not clearly identified.  Lumbar artery:  A lower abdominal lumbar artery was cannulated. There is no filling of the inferior right kidney.  Complications: None  Impression:There is no evidence for acute bleeding or a vascular lesion involving the main right renal artery and its branches. However, the inferior third of the right kidney is not clearly identified.  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  An infarction to the right kidney lower pole cannot be excluded.  These findings were discussed with Dr. Isabel Caprice.  The right kidney could be further evaluated with a CTA of the abdomen and pelvis to look for an accessory right renal artery and evaluate the enhancement pattern of the right kidney.   Original Report Authenticated By: Richarda Overlie, M.D.    Ir US Guide Vasc Access Right  08/02/2012  *RADIOLOGY REPORT*  Clinical history:41 year old with history of a right percutaneous nephrolithotomy procedure for a  kidney stone.  The patient has a right perinephric hematoma and hematuria.  Recent CT demonstrated enlargement of the right perinephric hematoma and concern for an underlying arterial lesion or bleeding.  PROCEDURE(S): AORTOGRAM; RIGHT RENAL ARTERIOGRAM; LUMBAR ARTERY INJECTION; ULTRASOUND GUIDANCE FOR VASCULAR ACCESS  Physician: Rachelle Hora. Henn, MD  Medications:Versed of 3 mg, Fentanyl 150 mcg. A radiology nurse  monitored the patient for moderate sedation.  Moderate sedation time:60 minutes  Fluoroscopy time: 14.2 minutes  Contrast:  65 ml Omnipaque-300  Procedure:The procedure was explained to the patient.  The risks and benefits of the procedure were discussed and the patient's questions were addressed.  Specifically, the risk of nephrotoxicity from iodinated contrast was discussed with the patient.  Informed consent was obtained from the patient.  The patient was placed supine on the interventional table.  The patient had palpable right groin pulse and palpable pedal pulses.  The right groin was prepped and draped in a sterile fashion.  Maximal barrier sterile technique was utilized including caps, mask, sterile gowns, sterile gloves, sterile drape, hand hygiene and skin antiseptic.  Using ultrasound guidance, the right common femoral artery was identified.  The skin was anesthetized with lidocaine.  A 21 gauge needle was directed into the right common femoral artery with ultrasound guidance. Micropuncture dilator set was placed.  A 5-French vascular sheath was placed over a Bentson wire.  A Sos catheter was reconstituted in the thoracic aorta.  The right renal artery was easily selected and a right renal angiogram was performed.  Attempted to cannulate an accessory right renal artery with Sos and C2 catheters.  An Omniflush catheter was placed in the proximal abdominal aorta and an aortogram was performed.  Another attempt to find an accessory right renal artery was performed with a Sos catheter.  A lumbar artery was cannulated in the lower abdominal aorta and an angiogram of this vessel was performed.  The Sos catheter was removed.  The 5-French vascular sheath would no longer aspirate.  As a result, a new 5-French sheath was placed over the [facility] wire. Contrast was injected through the sheath to confirm patency of the right common femoral artery and proximal femoral arteries.  The vascular sheath was removed with an  Exoseal closure device.  Findings:Right renal arteriogram:  The right renal artery supplies the upper and mid pole of the right kidney.  There is no evidence for active contrast extravasation and no evidence for a vascular lesion.  However, there is no filling of the lower third of the right kidney.  There may be a standing wave involving the first branch off the right renal artery. A few of the branches have a subtle beaded appearance but the findings are not clearly representative of fibromuscular dysplasia.  Aortogram:  The abdominal aorta is patent and filling of the common iliac arteries.  There is also filling of the celiac trunk, proximal left renal artery and superior mesenteric artery.  Lumbar vessels are identified in the lower abdominal aorta.  An accessory right renal artery was not clearly identified.  Lumbar artery:  A lower abdominal lumbar artery was cannulated. There is no filling of the inferior right kidney.  Complications: None  Impression:There is no evidence for acute bleeding or a vascular lesion involving the main right renal artery and its branches. However, the inferior third of the right kidney is not clearly identified.  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  An infarction to the right kidney lower  pole cannot be excluded.  These findings were discussed with Dr. Isabel Caprice.  The right kidney could be further evaluated with a CTA of the abdomen and pelvis to look for an accessory right renal artery and evaluate the enhancement pattern of the right kidney.   Original Report Authenticated By: Richarda Overlie, M.D.     Assessment/Plan: I reviewed all of his image studies and it appears that he has a fairly significant perinephric hematoma that is also having some continued bleeding into the collecting system. He had to have a Foley catheter reinserted last night and I placed this to CBI which has prevented further clot formation in the bladder. His hemoglobin  has drifted down again today and therefore I am going to transfuse him. In addition his creatinine was noted be 1.61 this morning so I called radiology and had canceled his CT angiogram for today. I will recheck his creatinine in the morning and if it is acceptable will then proceed with a CT angiogram. I have discussed with the patient the fact that exploration of his flank would very possibly result in loss of his kidney and therefore would like to make every attempt to manage this conservatively. Fortunately he is young and healthy. He has been tolerating his acute blood loss anemia and therefore I do not feel emergent flank exploration is currently necessary. This does however remain a possibility if his hemoglobin does not stabilize or he becomes hemodynamically unstable.     Transfuse 2 units of RBCs.    recheck hemoglobin after his transfusion this afternoon.  Recheck hemoglobin and BMP in the a.m.  I will hold off on his CT angiogram due to was elevated creatinine today and reconsider performing this tomorrow.   LOS: 12 days   Jared Mendez C 08/04/2012, 10:42 AM

## 2012-08-04 NOTE — Progress Notes (Signed)
CRITICAL VALUE ALERT  Critical value received:  hgb 6.6  Date of notification:  08/04/2012  Time of notification:  0600  Critical value read back:yes  Nurse who received alert:  mk  MD notified (1st page):  Dr. Vernie Ammons  Time of first page:  0700  MD notified (2nd page):  Time of second page:  Responding MD:  ottelin  Time MD responded:  [phone]

## 2012-08-05 ENCOUNTER — Inpatient Hospital Stay (HOSPITAL_COMMUNITY): Payer: MEDICAID

## 2012-08-05 LAB — BASIC METABOLIC PANEL
BUN: 7 mg/dL (ref 6–23)
CO2: 25 mEq/L (ref 19–32)
Calcium: 8.2 mg/dL — ABNORMAL LOW (ref 8.4–10.5)
Chloride: 102 mEq/L (ref 96–112)
Creatinine, Ser: 1.69 mg/dL — ABNORMAL HIGH (ref 0.50–1.35)
GFR calc Af Amer: 57 mL/min — ABNORMAL LOW (ref >90)
GFR calc non Af Amer: 49 mL/min — ABNORMAL LOW (ref >90)
Glucose, Bld: 109 mg/dL — ABNORMAL HIGH (ref 70–99)
Potassium: 4.5 mEq/L (ref 3.5–5.1)
Sodium: 135 mEq/L (ref 135–145)

## 2012-08-05 LAB — HEMOGLOBIN AND HEMATOCRIT, BLOOD
HCT: 28.1 % — ABNORMAL LOW (ref 39.0–52.0)
Hemoglobin: 9.1 g/dL — ABNORMAL LOW (ref 13.0–17.0)

## 2012-08-05 LAB — TYPE AND SCREEN
ABO/RH(D): O POS
Antibody Screen: NEGATIVE
Unit division: 0
Unit division: 0

## 2012-08-05 LAB — GLUCOSE, CAPILLARY

## 2012-08-05 MED ORDER — SODIUM CHLORIDE 0.9 % IR SOLN
3000.0000 mL | Status: DC
  Administered 2012-08-05: 3000 mL

## 2012-08-05 MED ORDER — IOHEXOL 350 MG/ML SOLN
100.0000 mL | Freq: Once | INTRAVENOUS | Status: AC | PRN
  Administered 2012-08-05: 100 mL via INTRAVENOUS

## 2012-08-05 NOTE — Progress Notes (Addendum)
Subjective: Patient reports flank pain moderate. His pain has not worsened. He is tolerating his Foley catheter which is on CBI and draining slightly pink fluid.  Objective: Vital signs in last 24 hours: Temp:  [97.8 F (36.6 C)-99.6 F (37.6 C)] 98.6 F (37 C) (10/06 0629) Pulse Rate:  [82-101] 100  (10/06 0629) Resp:  [16-20] 20  (10/06 0629) BP: (109-129)/(73-89) 118/80 mmHg (10/06 0629) SpO2:  [100 %] 100 % (10/06 0629)  Intake/Output from previous day: 10/05 0701 - 10/06 0700 In: 27120.8 [P.O.:1500; I.V.:3706.3; Blood:662.5; IV Piggyback:252] Out: 13086 [Urine:29025] Intake/Output this shift: Total I/O In: -  Out: 2000 [Urine:2000]  Physical Exam:  General: He is awake, alert and appears to be in no acute distress Abdomen is soft but tender on the right-hand side. No peritoneal signs are noted.   Lab Results:  Basename 08/05/12 0530 08/04/12 2101 08/04/12 0526  HGB 9.1* 9.7* 6.6*  HCT 28.1* 28.1* 18.9*   BMET  Basename 08/05/12 0530 08/04/12 0526  NA 135 133*  K 4.5 4.0  CL 102 102  CO2 25 24  GLUCOSE 109* 131*  BUN 7 6  CREATININE 1.69* 1.61*  CALCIUM 8.2* 8.3*   No results found for this basename: LABPT:3,INR:3 in the last 72 hours No results found for this basename: LABURIN:1 in the last 72 hours Results for orders placed during the hospital encounter of 07/23/12  URINE CULTURE     Status: Normal   Collection Time   07/26/12  2:50 PM      Component Value Range Status Comment   Specimen Description URINE, RANDOM   Final    Special Requests NONE   Final    Culture  Setup Time 07/27/2012 01:08   Final    Colony Count NO GROWTH   Final    Culture NO GROWTH   Final    Report Status 07/27/2012 FINAL   Final   URINE CULTURE     Status: Normal   Collection Time   07/28/12  9:43 AM      Component Value Range Status Comment   Specimen Description URINE, CLEAN CATCH   Final    Special Requests NONE   Final    Culture  Setup Time 07/28/2012 17:27   Final    Colony Count NO GROWTH   Final    Culture NO GROWTH   Final    Report Status 07/29/2012 FINAL   Final   CULTURE, BLOOD (ROUTINE X 2)     Status: Normal   Collection Time   07/29/12 10:46 AM      Component Value Range Status Comment   Specimen Description BLOOD LEFT ARM   Final    Special Requests BOTTLES DRAWN AEROBIC AND ANAEROBIC 5 CC EACH   Final    Culture  Setup Time 07/29/2012 14:00   Final    Culture NO GROWTH 5 DAYS   Final    Report Status 08/04/2012 FINAL   Final   CULTURE, BLOOD (ROUTINE X 2)     Status: Normal   Collection Time   07/29/12 10:50 AM      Component Value Range Status Comment   Specimen Description BLOOD LEFT HAND   Final    Special Requests BOTTLES DRAWN AEROBIC AND ANAEROBIC 5 CC EACH   Final    Culture  Setup Time 07/29/2012 14:00   Final    Culture NO GROWTH 5 DAYS   Final    Report Status 08/04/2012 FINAL   Final  CULTURE, EXPECTORATED SPUTUM-ASSESSMENT     Status: Normal   Collection Time   07/29/12 12:29 PM      Component Value Range Status Comment   Specimen Description SPUTUM   Final    Special Requests NONE   Final    Sputum evaluation     Final    Value: THIS SPECIMEN IS ACCEPTABLE. RESPIRATORY CULTURE REPORT TO FOLLOW.   Report Status 07/29/2012 FINAL   Final   CULTURE, RESPIRATORY     Status: Normal   Collection Time   07/29/12 12:29 PM      Component Value Range Status Comment   Specimen Description SPUTUM   Final    Special Requests NONE   Final    Gram Stain     Final    Value: NO WBC SEEN     NO SQUAMOUS EPITHELIAL CELLS SEEN     RARE GRAM POSITIVE COCCI     IN PAIRS RARE GRAM NEGATIVE RODS     RARE GRAM POSITIVE RODS   Culture NORMAL OROPHARYNGEAL FLORA   Final    Report Status 07/31/2012 FINAL   Final   BODY FLUID CULTURE     Status: Normal   Collection Time   07/31/12 10:57 AM      Component Value Range Status Comment   Specimen Description PLEURAL   Final    Special Requests Normal   Final    Gram Stain     Final    Value:  RARE WBC PRESENT, PREDOMINANTLY PMN     NO ORGANISMS SEEN   Culture NO GROWTH 3 DAYS   Final    Report Status 08/04/2012 FINAL   Final     Studies/Results: Dg Chest 2 View  08/03/2012  *RADIOLOGY REPORT*  Clinical Data: Follow up right effusion  CHEST - 2 VIEW  Comparison: 08/01/2012  Findings: No change in volume of right pleural effusion allowing for differences in technique and position.  Hazy overlying interstitial changes persist on the right.  Left lung remains clear. The heart and mediastinal structures remain normal.  IMPRESSION: No significant interval changes.  Right pleural effusion. Overlying interstitial opacities at the right base.  No pneumothorax.   Original Report Authenticated By: Mervin Hack, M.D.     Assessment/Plan: After his transfusion yesterday his hemoglobin improved significantly as expected. It appears relatively stable this morning. He is not developing any further pain. He is also not having significant gross hematuria at this time. His creatinine however remains elevated and reviewing his previous creatinines I note that it has fluctuated over time and I do not see a definite spike after his angiography indicating that his creatinine elevation is most likely not primarily secondary to his previous contrast load since his creatinine at the time of the study was already 1.4. I held off on his CT angiogram yesterday however his creatinine remains essentially the same as it was and therefore he has been maintained n.p.o. for consideration of CT angiography today. Before proceeding with that study I am going to speak with the radiologist regarding the safety of this study with contrast. I think it would be helpful to have this information in order to determine if there is a lower pole vessel that could be embolized and control the bleeding. The patient remains hemodynamically stable with a good urine output.   I will contact the radiologist this morning regarding the  best means of imaging in order to potentially decrease his bleeding and prevent the need for  a possible nephrectomy.  Serial hemoglobins will continue.  I will transfuse if needed.   LOS: 13 days   Jared Mendez C 08/05/2012, 7:47 AM  Addendum: I spoke with Dr. Fredia Sorrow and we discussed the case at length. Due to the patient's age and the fact that modified contrast protocol can be used as well as pre-and post procedural vigorous hydration in this young patient he felt that it would be safe to proceed with CT angiography. We both agree the benefit of the study greatly outweighs the risk of further contrast.

## 2012-08-06 LAB — CBC
HCT: 30 % — ABNORMAL LOW (ref 39.0–52.0)
Hemoglobin: 10.3 g/dL — ABNORMAL LOW (ref 13.0–17.0)
MCH: 29.6 pg (ref 26.0–34.0)
MCHC: 34.3 g/dL (ref 30.0–36.0)
MCV: 86.2 fL (ref 78.0–100.0)
Platelets: 537 10*3/uL — ABNORMAL HIGH (ref 150–400)
RBC: 3.48 MIL/uL — ABNORMAL LOW (ref 4.22–5.81)
RDW: 13.7 % (ref 11.5–15.5)
WBC: 20.6 10*3/uL — ABNORMAL HIGH (ref 4.0–10.5)

## 2012-08-06 LAB — BASIC METABOLIC PANEL
BUN: 7 mg/dL (ref 6–23)
CO2: 26 mEq/L (ref 19–32)
Calcium: 8.7 mg/dL (ref 8.4–10.5)
Chloride: 97 mEq/L (ref 96–112)
Creatinine, Ser: 1.66 mg/dL — ABNORMAL HIGH (ref 0.50–1.35)
GFR calc Af Amer: 58 mL/min — ABNORMAL LOW (ref >90)
GFR calc non Af Amer: 50 mL/min — ABNORMAL LOW (ref >90)
Glucose, Bld: 115 mg/dL — ABNORMAL HIGH (ref 70–99)
Potassium: 4.8 mEq/L (ref 3.5–5.1)
Sodium: 132 mEq/L — ABNORMAL LOW (ref 135–145)

## 2012-08-06 LAB — GLUCOSE, CAPILLARY
Glucose-Capillary: 113 mg/dL — ABNORMAL HIGH (ref 70–99)
Glucose-Capillary: 140 mg/dL — ABNORMAL HIGH (ref 70–99)
Glucose-Capillary: 122 mg/dL — ABNORMAL HIGH (ref 70–99)

## 2012-08-06 MED ORDER — VANCOMYCIN HCL 500 MG IV SOLR
500.0000 mg | Freq: Two times a day (BID) | INTRAVENOUS | Status: DC
  Administered 2012-08-06: 500 mg via INTRAVENOUS
  Filled 2012-08-06: qty 500
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
## 2012-08-06 NOTE — Progress Notes (Signed)
Patient complaining of discomfort at catheter site.  Patient has CBI infusing and no urine seen in drainage tubing.  Irrigated patient with 50 cc of normal saline at 0905 and 75 cc of urine with several dime sized dark clots returned.  Irrigated with 50 cc of normal saline and had zero urine return.  Dr. Isabel Caprice notified and new orders received.  Will continue to monitor patient.

## 2012-08-06 NOTE — Progress Notes (Signed)
Removed foley catheter at 1015 as ordered by Dr. Isabel Caprice.  At 1020 patient voided 150cc of urine and quarter sized, dark clot seen.  Patient states " I feel so much better and my pain is gone."   Will continue to monitor patient.

## 2012-08-06 NOTE — Progress Notes (Signed)
Patient ID: Jared Mendez, male   DOB: 1971-06-21, 41 y.o.   MRN: [id number] 14 Days Post-Op Subjective: Patient reports an uneventful night. He is not having any significant pain. His catheter drained well to the course of the evening and is still a light tea-colored. [name] hemoglobin has actually increased a full point and there is no evidence of any ongoing bleeding at least in the last 24-36 hours.  Objective: Vital signs in last 24 hours: Temp:  [97.8 F (36.6 C)-98.9 F (37.2 C)] 98.9 F (37.2 C) (10/07 0535) Pulse Rate:  [88-98] 94  (10/07 0535) Resp:  [18-20] 18  (10/07 0535) BP: (128-137)/(85-91) 128/88 mmHg (10/07 0535) SpO2:  [100 %] 100 % (10/07 0535)  Intake/Output from previous day: 10/06 0701 - 10/07 0700 In: 34776.7 [I.V.:4576.7; IV Piggyback:200] Out: 78295 [Urine:39325] Intake/Output this shift: Total I/O In: -  Out: 2800 [Urine:2800]  Physical Exam:  Constitutional: Vital signs reviewed. WD WN in NAD   Eyes: PERRL, No scleral icterus.   Cardiovascular: RRR Pulmonary/Chest: Normal effort Abdominal: Soft. Non-tender, non-distended, bowel sounds are normal, no masses, organomegaly, or guarding present.  Genitourinary: Large bore catheter draining tea-colored urine. Extremities: No cyanosis or edema   Lab Results:  Basename 08/06/12 0047 08/05/12 0530 08/04/12 2101  HGB 10.3* 9.1* 9.7*  HCT 30.0* 28.1* 28.1*   BMET  Basename 08/06/12 0047 08/05/12 0530  NA 132* 135  K 4.8 4.5  CL 97 102  CO2 26 25  GLUCOSE 115* 109*  BUN 7 7  CREATININE 1.66* 1.69*  CALCIUM 8.7 8.2*   No results found for this basename: LABPT:3,INR:3 in the last 72 hours No results found for this basename: LABURIN:1 in the last 72 hours Results for orders placed during the hospital encounter of 07/23/12  URINE CULTURE     Status: Normal   Collection Time   07/26/12  2:50 PM      Component Value Range Status Comment   Specimen Description URINE, RANDOM   Final    Special  Requests NONE   Final    Culture  Setup Time 07/27/2012 01:08   Final    Colony Count NO GROWTH   Final    Culture NO GROWTH   Final    Report Status 07/27/2012 FINAL   Final   URINE CULTURE     Status: Normal   Collection Time   07/28/12  9:43 AM      Component Value Range Status Comment   Specimen Description URINE, CLEAN CATCH   Final    Special Requests NONE   Final    Culture  Setup Time 07/28/2012 17:27   Final    Colony Count NO GROWTH   Final    Culture NO GROWTH   Final    Report Status 07/29/2012 FINAL   Final   CULTURE, BLOOD (ROUTINE X 2)     Status: Normal   Collection Time   07/29/12 10:46 AM      Component Value Range Status Comment   Specimen Description BLOOD LEFT ARM   Final    Special Requests BOTTLES DRAWN AEROBIC AND ANAEROBIC 5 CC EACH   Final    Culture  Setup Time 07/29/2012 14:00   Final    Culture NO GROWTH 5 DAYS   Final    Report Status 08/04/2012 FINAL   Final   CULTURE, BLOOD (ROUTINE X 2)     Status: Normal   Collection Time   07/29/12 10:50 AM  Component Value Range Status Comment   Specimen Description BLOOD LEFT HAND   Final    Special Requests BOTTLES DRAWN AEROBIC AND ANAEROBIC 5 CC EACH   Final    Culture  Setup Time 07/29/2012 14:00   Final    Culture NO GROWTH 5 DAYS   Final    Report Status 08/04/2012 FINAL   Final   CULTURE, EXPECTORATED SPUTUM-ASSESSMENT     Status: Normal   Collection Time   07/29/12 12:29 PM      Component Value Range Status Comment   Specimen Description SPUTUM   Final    Special Requests NONE   Final    Sputum evaluation     Final    Value: THIS SPECIMEN IS ACCEPTABLE. RESPIRATORY CULTURE REPORT TO FOLLOW.   Report Status 07/29/2012 FINAL   Final   CULTURE, RESPIRATORY     Status: Normal   Collection Time   07/29/12 12:29 PM      Component Value Range Status Comment   Specimen Description SPUTUM   Final    Special Requests NONE   Final    Gram Stain     Final    Value: NO WBC SEEN     NO SQUAMOUS  EPITHELIAL CELLS SEEN     RARE GRAM POSITIVE COCCI     IN PAIRS RARE GRAM NEGATIVE RODS     RARE GRAM POSITIVE RODS   Culture NORMAL OROPHARYNGEAL FLORA   Final    Report Status 07/31/2012 FINAL   Final   BODY FLUID CULTURE     Status: Normal   Collection Time   07/31/12 10:57 AM      Component Value Range Status Comment   Specimen Description PLEURAL   Final    Special Requests Normal   Final    Gram Stain     Final    Value: RARE WBC PRESENT, PREDOMINANTLY PMN     NO ORGANISMS SEEN   Culture NO GROWTH 3 DAYS   Final    Report Status 08/04/2012 FINAL   Final     Studies/Results: Ct Angio Abd/pel W/ And/or W/o  08/05/2012  *RADIOLOGY REPORT*  Clinical Data:  Status post percutaneous nephrolithotomy procedure on 07/23/2012 to remove a right renal calculus.  The procedure has been complicated by retroperitoneal hemorrhage and hemorrhage into the collecting system with inability to determine source of bleeding by angiography on 08/02/2012.  Clinically there is continued bleeding and CT is performed to assess blood supply of the kidney and determine whether a bleeding source can be identified.  CT ANGIOGRAPHY ABDOMEN AND PELVIS  Technique:  Multidetector CT imaging of the abdomen and pelvis was performed using the standard protocol during bolus administration of intravenous contrast.  Multiplanar reconstructed images including MIPs were obtained and reviewed to evaluate the vascular anatomy.  Contrast: OMNIPAQUE IOHEXOL 350 MG/ML SOLN  Comparison:  Angiography dated 08/02/2012 as well as multiple recent CT examinations.  Findings:  Unenhanced imaging shows high density blood clot and hematoma in the posterior perinephric space of the right retroperitoneum as well as in the right renal pelvis and lower pole of the right kidney. As depicted by arteriography, a main right renal artery supplies the upper two thirds of the right kidney.  By CTA, this artery remains normally patent with no evidence  of distal branch pseudoaneurysm or contrast extravasation.  The lower pole of the right kidney is supplied by an accessory artery that emanates from the anterior aspect of the distal  aorta distal to the IMA origin and approximately 3 cm above the aortic bifurcation.  This vessel supplies branches to the lower poles of both the right and left kidneys.  The renal parenchyma of the lower right kidney is disrupted both anteriorly and posteriorly by hematoma.  Based on configuration of the hematoma and the blood supply of the kidney, it is suspected that the more likely source of blood supply to the predominant area of renal injury is within the inferior right kidney at the level of accessory artery supply.  On the delayed renal phase of imaging, there is suggestion of potentially a small area of delayed focal extravasation along the lateral aspect of the lower pole parenchymal defect.  This is close to the location of a lateral branch of the accessory lower pole artery on the arterial phase. It therefore may be worthwhile to consider a second look arteriogram, concentrating on subselective catheterization of lower pole supply.  The left kidney shows some stable scarring and no evidence of acute abnormality.  Other vessels in the abdomen and pelvis show normal patency.  No venous injury is identified on the venous phase of imaging. Evolving hemorrhage noted surrounding the right kidney of various density.  There is no evidence of overt abscess.  No other acute abnormalities are identified.   Review of the MIP images confirms the above findings.  IMPRESSION: Evolving retroperitoneal and parenchymal hemorrhage of the right kidney.  Based on evaluation of arterial anatomy, it is suspected that bleeding may be emanating from a branch of an accessory lower pole renal artery that emanates off of the anterior distal aorta and supplies branches to the lower poles of both kidneys.  It may be worthwhile considering a second look  arteriogram focusing on this vessel to determine whether there is an artery that may be treatable with embolization.  Findings were discussed directly with Dr. Vernie Ammons at the time of interpretation.   Original Report Authenticated By: Reola Calkins, M.D.     Assessment/Plan:   Jared Mendez has had significant problems with perinephric as well as renal bleeding gross hematuria and clot urinary retention. His situation has stabilized. His hemoglobin has now increased and there is no active bleeding clinically at this time. The CT angio was extremely helpful at outlying this [name] vascular anatomy. If he develops evidence of recurrent bleeding with drop in hemoglobin, tachycardia etc. we will ask interventional radiology to have a second look at embolization of that lower pole vessel. Open surgical exploration would carry a high risk of nephrectomy.   LOS: 14 days   Jared Mendez 08/06/2012, 7:46 AM

## 2012-08-06 NOTE — Progress Notes (Signed)
Tried to encourage pt to walk. Pt refused stating he is very sore and he cannot control bowels when ambulating. Will continue to encourage.  Earnest Conroy. Clelia Croft, RN

## 2012-08-06 NOTE — Progress Notes (Signed)
ANTIBIOTIC CONSULT NOTE - FOLLOW UP  Pharmacy Consult for vancomycin Indication: rule out pneumonia  No Known Allergies  Patient Measurements: Height: 5\' 9"  (175.3 cm) Weight: 140 lb 10.5 oz (63.8 kg) IBW/kg (Calculated) : 70.7  Adjusted Body Weight:   Vital Signs: Temp: 98.8 F (37.1 C) (10/06 2209) Temp src: Oral (10/06 2209) BP: 134/91 mmHg (10/06 2209) Pulse Rate: 88  (10/06 2209) Intake/Output from previous day: 10/06 0701 - 10/07 0700 In: 40981 [I.V.:2350; IV Piggyback:200] Out: 32600 [Urine:32600] Intake/Output from this shift: Total I/O In: 3550 [I.V.:850; Other:2700] Out: 19147 [Urine:11875]  Labs:  Basename 08/06/12 0047 08/05/12 0530 08/04/12 2101 08/04/12 0526 08/03/12 0426  WBC 20.6* -- -- 22.4* 23.9*  HGB 10.3* 9.1* 9.7* -- --  PLT 537* -- -- 474* 430*  LABCREA -- -- -- -- --  CREATININE 1.66* 1.69* -- 1.61* --   Estimated Creatinine Clearance: 53.4 ml/min (by C-G formula based on Cr of 1.66).  Basename 08/06/12 0047 08/03/12 1305  VANCOTROUGH 22.4* 19.5  VANCOPEAK -- --  Drue Dun -- --  GENTTROUGH -- --  GENTPEAK -- --  GENTRANDOM -- --  TOBRATROUGH -- --  TOBRAPEAK -- --  TOBRARND -- --  AMIKACINPEAK -- --  AMIKACINTROU -- --  AMIKACIN -- --     Microbiology: Recent Results (from the past 720 hour(s))  SURGICAL PCR SCREEN     Status: Normal   Collection Time   07/17/12  9:45 AM      Component Value Range Status Comment   MRSA, PCR NEGATIVE  NEGATIVE Final    Staphylococcus aureus NEGATIVE  NEGATIVE Final   URINE CULTURE     Status: Normal   Collection Time   07/26/12  2:50 PM      Component Value Range Status Comment   Specimen Description URINE, RANDOM   Final    Special Requests NONE   Final    Culture  Setup Time 07/27/2012 01:08   Final    Colony Count NO GROWTH   Final    Culture NO GROWTH   Final    Report Status 07/27/2012 FINAL   Final   URINE CULTURE     Status: Normal   Collection Time   07/28/12  9:43 AM   Component Value Range Status Comment   Specimen Description URINE, CLEAN CATCH   Final    Special Requests NONE   Final    Culture  Setup Time 07/28/2012 17:27   Final    Colony Count NO GROWTH   Final    Culture NO GROWTH   Final    Report Status 07/29/2012 FINAL   Final   CULTURE, BLOOD (ROUTINE X 2)     Status: Normal   Collection Time   07/29/12 10:46 AM      Component Value Range Status Comment   Specimen Description BLOOD LEFT ARM   Final    Special Requests BOTTLES DRAWN AEROBIC AND ANAEROBIC 5 CC EACH   Final    Culture  Setup Time 07/29/2012 14:00   Final    Culture NO GROWTH 5 DAYS   Final    Report Status 08/04/2012 FINAL   Final   CULTURE, BLOOD (ROUTINE X 2)     Status: Normal   Collection Time   07/29/12 10:50 AM      Component Value Range Status Comment   Specimen Description BLOOD LEFT HAND   Final    Special Requests BOTTLES DRAWN AEROBIC AND ANAEROBIC 5 CC EACH  Final    Culture  Setup Time 07/29/2012 14:00   Final    Culture NO GROWTH 5 DAYS   Final    Report Status 08/04/2012 FINAL   Final   CULTURE, EXPECTORATED SPUTUM-ASSESSMENT     Status: Normal   Collection Time   07/29/12 12:29 PM      Component Value Range Status Comment   Specimen Description SPUTUM   Final    Special Requests NONE   Final    Sputum evaluation     Final    Value: THIS SPECIMEN IS ACCEPTABLE. RESPIRATORY CULTURE REPORT TO FOLLOW.   Report Status 07/29/2012 FINAL   Final   CULTURE, RESPIRATORY     Status: Normal   Collection Time   07/29/12 12:29 PM      Component Value Range Status Comment   Specimen Description SPUTUM   Final    Special Requests NONE   Final    Gram Stain     Final    Value: NO WBC SEEN     NO SQUAMOUS EPITHELIAL CELLS SEEN     RARE GRAM POSITIVE COCCI     IN PAIRS RARE GRAM NEGATIVE RODS     RARE GRAM POSITIVE RODS   Culture NORMAL OROPHARYNGEAL FLORA   Final    Report Status 07/31/2012 FINAL   Final   BODY FLUID CULTURE     Status: Normal   Collection Time     07/31/12 10:57 AM      Component Value Range Status Comment   Specimen Description PLEURAL   Final    Special Requests Normal   Final    Gram Stain     Final    Value: RARE WBC PRESENT, PREDOMINANTLY PMN     NO ORGANISMS SEEN   Culture NO GROWTH 3 DAYS   Final    Report Status 08/04/2012 FINAL   Final     Anti-infectives     Start     Dose/Rate Route Frequency Ordered Stop   08/06/12 0600   vancomycin (VANCOCIN) 500 mg in sodium chloride 0.9 % 100 mL IVPB        500 mg 100 mL/hr over 60 Minutes Intravenous Every 12 hours 08/06/12 0206     07/30/12 1400   vancomycin (VANCOCIN) 750 mg in sodium chloride 0.9 % 150 mL IVPB  Status:  Discontinued        750 mg 150 mL/hr over 60 Minutes Intravenous Every 12 hours 07/30/12 1234 08/06/12 0205   07/30/12 1300  piperacillin-tazobactam (ZOSYN) IVPB 3.375 g       3.375 g 12.5 mL/hr over 240 Minutes Intravenous 3 times per day 07/30/12 1232     07/29/12 1100   cefTRIAXone (ROCEPHIN) 1 g in dextrose 5 % 50 mL IVPB  Status:  Discontinued        1 g 100 mL/hr over 30 Minutes Intravenous Every 24 hours 07/29/12 0952 07/30/12 1154   07/23/12 2200   ciprofloxacin (CIPRO) IVPB 400 mg  Status:  Discontinued        400 mg 200 mL/hr over 60 Minutes Intravenous Every 12 hours 07/23/12 1409 07/29/12 0952   07/23/12 1400   ciprofloxacin (CIPRO) IVPB 400 mg  Status:  Discontinued        400 mg 200 mL/hr over 60 Minutes Intravenous On call 07/23/12 1400 07/23/12 1451          Assessment: Patient with high vancomycin level.   Goal of Therapy:  Vancomycin trough level  15-20 mcg/ml  Plan:  Measure antibiotic drug levels at steady state Follow up culture results Change to 500mg  iv q12hr, next dose at 0600  Darlina Guys, Jacquenette Shone Crowford 08/06/2012,2:06 AM

## 2012-08-07 ENCOUNTER — Inpatient Hospital Stay (HOSPITAL_COMMUNITY): Payer: MEDICAID

## 2012-08-07 LAB — GLUCOSE, CAPILLARY
Glucose-Capillary: 96 mg/dL (ref 70–99)
Glucose-Capillary: 112 mg/dL — ABNORMAL HIGH (ref 70–99)
Glucose-Capillary: 94 mg/dL (ref 70–99)
Glucose-Capillary: 91 mg/dL (ref 70–99)

## 2012-08-07 LAB — BASIC METABOLIC PANEL
BUN: 6 mg/dL (ref 6–23)
CO2: 23 mEq/L (ref 19–32)
Calcium: 8.7 mg/dL (ref 8.4–10.5)
Chloride: 102 mEq/L (ref 96–112)
Creatinine, Ser: 1.48 mg/dL — ABNORMAL HIGH (ref 0.50–1.35)
GFR calc Af Amer: 67 mL/min — ABNORMAL LOW (ref >90)
GFR calc non Af Amer: 58 mL/min — ABNORMAL LOW (ref >90)
Glucose, Bld: 115 mg/dL — ABNORMAL HIGH (ref 70–99)
Potassium: 4.4 mEq/L (ref 3.5–5.1)
Sodium: 135 mEq/L (ref 135–145)

## 2012-08-07 LAB — CBC
Hemoglobin: 9.5 g/dL — ABNORMAL LOW (ref 13.0–17.0)
MCHC: 33.1 g/dL (ref 30.0–36.0)
Platelets: 628 10*3/uL — ABNORMAL HIGH (ref 150–400)
RDW: 13.6 % (ref 11.5–15.5)

## 2012-08-07 NOTE — Progress Notes (Signed)
The patient had c/o bladder feeling full. Patient voided 100 ml and the post void residual was 481 ml as per the bladder scan. The MD was notified and new orders were given to RN.
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
## 2012-08-07 NOTE — Progress Notes (Signed)
Patient ID: Jared Mendez, male   DOB: 01/08/1971, 41 y.o.   MRN: [id number] 15 Days Post-Op Subjective: Patient reports no new issues. Had an uneventful night.   Objective: Vital signs in last 24 hours: Temp:  [98.5 F (36.9 C)-98.7 F (37.1 C)] 98.6 F (37 C) (10/08 0557) Pulse Rate:  [90-103] 90  (10/08 0557) Resp:  [16-18] 16  (10/08 0557) BP: (123-137)/(77-90) 127/77 mmHg (10/08 0557) SpO2:  [100 %] 100 % (10/08 0557)  Intake/Output from previous day: 10/07 0701 - 10/08 0700 In: 4983.3 [P.O.:360; I.V.:3323.3] Out: 7925 [Urine:7925] Intake/Output this shift: Total I/O In: 1693.3 [I.V.:1693.3] Out: -   Physical Exam:  Constitutional: Vital signs reviewed. WD WN in NAD   Eyes: PERRL, No scleral icterus.   Cardiovascular: RRR Pulmonary/Chest: Normal effort Abdominal: Soft. Non-tender, non-distended, bowel sounds are normal, no masses, organomegaly, or guarding present.  Genitourinary:ok Extremities: No cyanosis or edema   Lab Results:  Basename 08/07/12 0433 08/06/12 0047 08/05/12 0530  HGB 9.5* 10.3* 9.1*  HCT 28.7* 30.0* 28.1*   BMET  Basename 08/07/12 0433 08/06/12 0047  NA 135 132*  K 4.4 4.8  CL 102 97  CO2 23 26  GLUCOSE 115* 115*  BUN 6 7  CREATININE 1.48* 1.66*  CALCIUM 8.7 8.7   No results found for this basename: LABPT:3,INR:3 in the last 72 hours No results found for this basename: LABURIN:1 in the last 72 hours Results for orders placed during the hospital encounter of 07/23/12  URINE CULTURE     Status: Normal   Collection Time   07/26/12  2:50 PM      Component Value Range Status Comment   Specimen Description URINE, RANDOM   Final    Special Requests NONE   Final    Culture  Setup Time 07/27/2012 01:08   Final    Colony Count NO GROWTH   Final    Culture NO GROWTH   Final    Report Status 07/27/2012 FINAL   Final   URINE CULTURE     Status: Normal   Collection Time   07/28/12  9:43 AM      Component Value Range Status Comment   Specimen Description URINE, CLEAN CATCH   Final    Special Requests NONE   Final    Culture  Setup Time 07/28/2012 17:27   Final    Colony Count NO GROWTH   Final    Culture NO GROWTH   Final    Report Status 07/29/2012 FINAL   Final   CULTURE, BLOOD (ROUTINE X 2)     Status: Normal   Collection Time   07/29/12 10:46 AM      Component Value Range Status Comment   Specimen Description BLOOD LEFT ARM   Final    Special Requests BOTTLES DRAWN AEROBIC AND ANAEROBIC 5 CC EACH   Final    Culture  Setup Time 07/29/2012 14:00   Final    Culture NO GROWTH 5 DAYS   Final    Report Status 08/04/2012 FINAL   Final   CULTURE, BLOOD (ROUTINE X 2)     Status: Normal   Collection Time   07/29/12 10:50 AM      Component Value Range Status Comment   Specimen Description BLOOD LEFT HAND   Final    Special Requests BOTTLES DRAWN AEROBIC AND ANAEROBIC 5 CC EACH   Final    Culture  Setup Time 07/29/2012 14:00   Final    Culture NO  GROWTH 5 DAYS   Final    Report Status 08/04/2012 FINAL   Final   CULTURE, EXPECTORATED SPUTUM-ASSESSMENT     Status: Normal   Collection Time   07/29/12 12:29 PM      Component Value Range Status Comment   Specimen Description SPUTUM   Final    Special Requests NONE   Final    Sputum evaluation     Final    Value: THIS SPECIMEN IS ACCEPTABLE. RESPIRATORY CULTURE REPORT TO FOLLOW.   Report Status 07/29/2012 FINAL   Final   CULTURE, RESPIRATORY     Status: Normal   Collection Time   07/29/12 12:29 PM      Component Value Range Status Comment   Specimen Description SPUTUM   Final    Special Requests NONE   Final    Gram Stain     Final    Value: NO WBC SEEN     NO SQUAMOUS EPITHELIAL CELLS SEEN     RARE GRAM POSITIVE COCCI     IN PAIRS RARE GRAM NEGATIVE RODS     RARE GRAM POSITIVE RODS   Culture NORMAL OROPHARYNGEAL FLORA   Final    Report Status 07/31/2012 FINAL   Final   BODY FLUID CULTURE     Status: Normal   Collection Time   07/31/12 10:57 AM      Component Value  Range Status Comment   Specimen Description PLEURAL   Final    Special Requests Normal   Final    Gram Stain     Final    Value: RARE WBC PRESENT, PREDOMINANTLY PMN     NO ORGANISMS SEEN   Culture NO GROWTH 3 DAYS   Final    Report Status 08/04/2012 FINAL   Final     Studies/Results: Ct Angio Abd/pel W/ And/or W/o  08/05/2012  *RADIOLOGY REPORT*  Clinical Data:  Status post percutaneous nephrolithotomy procedure on 07/23/2012 to remove a right renal calculus.  The procedure has been complicated by retroperitoneal hemorrhage and hemorrhage into the collecting system with inability to determine source of bleeding by angiography on 08/02/2012.  Clinically there is continued bleeding and CT is performed to assess blood supply of the kidney and determine whether a bleeding source can be identified.  CT ANGIOGRAPHY ABDOMEN AND PELVIS  Technique:  Multidetector CT imaging of the abdomen and pelvis was performed using the standard protocol during bolus administration of intravenous contrast.  Multiplanar reconstructed images including MIPs were obtained and reviewed to evaluate the vascular anatomy.  Contrast: OMNIPAQUE IOHEXOL 350 MG/ML SOLN  Comparison:  Angiography dated 08/02/2012 as well as multiple recent CT examinations.  Findings:  Unenhanced imaging shows high density blood clot and hematoma in the posterior perinephric space of the right retroperitoneum as well as in the right renal pelvis and lower pole of the right kidney. As depicted by arteriography, a main right renal artery supplies the upper two thirds of the right kidney.  By CTA, this artery remains normally patent with no evidence of distal branch pseudoaneurysm or contrast extravasation.  The lower pole of the right kidney is supplied by an accessory artery that emanates from the anterior aspect of the distal aorta distal to the IMA origin and approximately 3 cm above the aortic bifurcation.  This vessel supplies branches to the lower  poles of both the right and left kidneys.  The renal parenchyma of the lower right kidney is disrupted both anteriorly and posteriorly by hematoma.  Based on configuration of the hematoma and the blood supply of the kidney, it is suspected that the more likely source of blood supply to the predominant area of renal injury is within the inferior right kidney at the level of accessory artery supply.  On the delayed renal phase of imaging, there is suggestion of potentially a small area of delayed focal extravasation along the lateral aspect of the lower pole parenchymal defect.  This is close to the location of a lateral branch of the accessory lower pole artery on the arterial phase. It therefore may be worthwhile to consider a second look arteriogram, concentrating on subselective catheterization of lower pole supply.  The left kidney shows some stable scarring and no evidence of acute abnormality.  Other vessels in the abdomen and pelvis show normal patency.  No venous injury is identified on the venous phase of imaging. Evolving hemorrhage noted surrounding the right kidney of various density.  There is no evidence of overt abscess.  No other acute abnormalities are identified.   Review of the MIP images confirms the above findings.  IMPRESSION: Evolving retroperitoneal and parenchymal hemorrhage of the right kidney.  Based on evaluation of arterial anatomy, it is suspected that bleeding may be emanating from a branch of an accessory lower pole renal artery that emanates off of the anterior distal aorta and supplies branches to the lower poles of both kidneys.  It may be worthwhile considering a second look arteriogram focusing on this vessel to determine whether there is an artery that may be treatable with embolization.  Findings were discussed directly with Dr. Vernie Ammons at the time of interpretation.   Original Report Authenticated By: Reola Calkins, M.D.     Assessment/Plan:   Improving. No fever with  declining WBC off antibiotics. Renal Fx also better. Will observe today.   LOS: 15 days   Ozzie Knobel S 08/07/2012, 7:45 AM

## 2012-08-08 LAB — BASIC METABOLIC PANEL
BUN: 7 mg/dL (ref 6–23)
CO2: 26 mEq/L (ref 19–32)
Calcium: 9.1 mg/dL (ref 8.4–10.5)
Chloride: 99 mEq/L (ref 96–112)
Creatinine, Ser: 1.48 mg/dL — ABNORMAL HIGH (ref 0.50–1.35)
GFR calc Af Amer: 67 mL/min — ABNORMAL LOW (ref >90)
GFR calc non Af Amer: 58 mL/min — ABNORMAL LOW (ref >90)
Glucose, Bld: 96 mg/dL (ref 70–99)
Potassium: 4.1 mEq/L (ref 3.5–5.1)
Sodium: 133 mEq/L — ABNORMAL LOW (ref 135–145)

## 2012-08-08 LAB — HEMOGLOBIN AND HEMATOCRIT, BLOOD: Hemoglobin: 9.6 g/dL — ABNORMAL LOW (ref 13.0–17.0)

## 2012-08-08 LAB — GLUCOSE, CAPILLARY
Glucose-Capillary: 103 mg/dL — ABNORMAL HIGH (ref 70–99)
Glucose-Capillary: 71 mg/dL (ref 70–99)

## 2012-08-08 NOTE — Progress Notes (Signed)
The patient stated that he felt like his "heart was racing". The RN checked the patient and found that the HR was between 85-92 bpm.

## 2012-08-08 NOTE — Progress Notes (Signed)
Patient ID: Jared Mendez, male   DOB: 1971/09/04, 41 y.o.   MRN: [id number] 16 Days Post-Op Subjective: Patient reports no new complaints or issues. He had a good night last night. He has been tolerating a general diet well. He has been voiding well. He did pass some old clots. He has not had any active bleeding. The patient had been written for a CBC this morning but is not been done at this time. Renal function is stable.  Objective: Vital signs in last 24 hours: Temp:  [98.2 F (36.8 C)-99 F (37.2 C)] 98.6 F (37 C) (10/09 4098) Pulse Rate:  [85-98] 98  (10/09 0611) Resp:  [14-18] 14  (10/09 0611) BP: (108-130)/(71-82) 108/71 mmHg (10/09 0611) SpO2:  [100 %] 100 % (10/09 0611)  Intake/Output from previous day: 10/08 0701 - 10/09 0700 In: 4153.9 [P.O.:720; I.V.:3422.9; IV Piggyback:11] Out: 4931 [Urine:4931] Intake/Output this shift: Total I/O In: -  Out: 400 [Urine:400]  Physical Exam:  Constitutional: Vital signs reviewed. WD WN in NAD   Eyes: PERRL, No scleral icterus.   Cardiovascular: RRR Pulmonary/Chest: Normal effort Abdominal: Soft. Non-tender, non-distended, bowel sounds are normal, no masses, organomegaly, or guarding present.  Genitourinary: No Extremities: No cyanosis or edema   Lab Results:  Basename 08/07/12 0433 08/06/12 0047  HGB 9.5* 10.3*  HCT 28.7* 30.0*   BMET  Basename 08/08/12 0440 08/07/12 0433  NA 133* 135  K 4.1 4.4  CL 99 102  CO2 26 23  GLUCOSE 96 115*  BUN 7 6  CREATININE 1.48* 1.48*  CALCIUM 9.1 8.7   No results found for this basename: LABPT:3,INR:3 in the last 72 hours No results found for this basename: LABURIN:1 in the last 72 hours Results for orders placed during the hospital encounter of 07/23/12  URINE CULTURE     Status: Normal   Collection Time   07/26/12  2:50 PM      Component Value Range Status Comment   Specimen Description URINE, RANDOM   Final    Special Requests NONE   Final    Culture  Setup Time 07/27/2012  01:08   Final    Colony Count NO GROWTH   Final    Culture NO GROWTH   Final    Report Status 07/27/2012 FINAL   Final   URINE CULTURE     Status: Normal   Collection Time   07/28/12  9:43 AM      Component Value Range Status Comment   Specimen Description URINE, CLEAN CATCH   Final    Special Requests NONE   Final    Culture  Setup Time 07/28/2012 17:27   Final    Colony Count NO GROWTH   Final    Culture NO GROWTH   Final    Report Status 07/29/2012 FINAL   Final   CULTURE, BLOOD (ROUTINE X 2)     Status: Normal   Collection Time   07/29/12 10:46 AM      Component Value Range Status Comment   Specimen Description BLOOD LEFT ARM   Final    Special Requests BOTTLES DRAWN AEROBIC AND ANAEROBIC 5 CC EACH   Final    Culture  Setup Time 07/29/2012 14:00   Final    Culture NO GROWTH 5 DAYS   Final    Report Status 08/04/2012 FINAL   Final   CULTURE, BLOOD (ROUTINE X 2)     Status: Normal   Collection Time   07/29/12 10:50 AM  Component Value Range Status Comment   Specimen Description BLOOD LEFT HAND   Final    Special Requests BOTTLES DRAWN AEROBIC AND ANAEROBIC 5 CC EACH   Final    Culture  Setup Time 07/29/2012 14:00   Final    Culture NO GROWTH 5 DAYS   Final    Report Status 08/04/2012 FINAL   Final   CULTURE, EXPECTORATED SPUTUM-ASSESSMENT     Status: Normal   Collection Time   07/29/12 12:29 PM      Component Value Range Status Comment   Specimen Description SPUTUM   Final    Special Requests NONE   Final    Sputum evaluation     Final    Value: THIS SPECIMEN IS ACCEPTABLE. RESPIRATORY CULTURE REPORT TO FOLLOW.   Report Status 07/29/2012 FINAL   Final   CULTURE, RESPIRATORY     Status: Normal   Collection Time   07/29/12 12:29 PM      Component Value Range Status Comment   Specimen Description SPUTUM   Final    Special Requests NONE   Final    Gram Stain     Final    Value: NO WBC SEEN     NO SQUAMOUS EPITHELIAL CELLS SEEN     RARE GRAM POSITIVE COCCI     IN PAIRS  RARE GRAM NEGATIVE RODS     RARE GRAM POSITIVE RODS   Culture NORMAL OROPHARYNGEAL FLORA   Final    Report Status 07/31/2012 FINAL   Final   BODY FLUID CULTURE     Status: Normal   Collection Time   07/31/12 10:57 AM      Component Value Range Status Comment   Specimen Description PLEURAL   Final    Special Requests Normal   Final    Gram Stain     Final    Value: RARE WBC PRESENT, PREDOMINANTLY PMN     NO ORGANISMS SEEN   Culture NO GROWTH 3 DAYS   Final    Report Status 08/04/2012 FINAL   Final     Studies/Results: Dg Chest 2 View  08/07/2012  *RADIOLOGY REPORT*  Clinical Data: Pneumonia  CHEST - 2 VIEW  Comparison: August 03, 2012  Findings:  There is slightly less consolidation in the posterior right base.  Moderate infiltrate does remain in this area.  There is patchy atelectasis in the left base medially.  Left lung is otherwise clear.  Heart size and very vascularity are normal.  No adenopathy.  No bone lesions.  There is postoperative change in the upper abdomen.  IMPRESSION:   Persistent pneumonitis right base posteriorly with mild partial clearing compared to recent prior study.  Atelectatic changes noted in the medial left base which appears new.   Original Report Authenticated By: Arvin Collard. WOODRUFF III, M.D.     Assessment/Plan:   Stable. We do need to check his hemoglobin but there is no clinical evidence of these have any significant ongoing bleeding. If things remain stable my plan is to discharge her later today or tomorrow morning.   LOS: 16 days   Blake Vetrano S 08/08/2012, 11:13 AM
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
## 2012-08-09 LAB — BASIC METABOLIC PANEL
BUN: 9 mg/dL (ref 6–23)
CO2: 22 mEq/L (ref 19–32)
Calcium: 9.2 mg/dL (ref 8.4–10.5)
Chloride: 98 mEq/L (ref 96–112)
Creatinine, Ser: 1.43 mg/dL — ABNORMAL HIGH (ref 0.50–1.35)
GFR calc Af Amer: 70 mL/min — ABNORMAL LOW (ref >90)
GFR calc non Af Amer: 60 mL/min — ABNORMAL LOW (ref >90)
Glucose, Bld: 92 mg/dL (ref 70–99)
Potassium: 4 mEq/L (ref 3.5–5.1)
Sodium: 132 mEq/L — ABNORMAL LOW (ref 135–145)

## 2012-08-09 LAB — CBC
MCH: 28.7 pg (ref 26.0–34.0)
MCHC: 33 g/dL (ref 30.0–36.0)
Platelets: 695 10*3/uL — ABNORMAL HIGH (ref 150–400)

## 2012-08-09 MED ORDER — HYDROCODONE-ACETAMINOPHEN 5-325 MG PO TABS: 1.0000 | ORAL_TABLET | Freq: Four times a day (QID) | ORAL | Status: DC | PRN

## 2012-08-09 NOTE — Progress Notes (Signed)
Patient discharge to home, no complaints of any pain or discomfort. D/c instructions done and given the patient. PIV removed no s/s of infection noted.

## 2012-08-09 NOTE — Discharge Summary (Signed)
Physician Discharge Summary  Patient ID: Jared Mendez MRN: [id number] DOB/AGE: August 20, 1971 40 y.o.  Admit date: 07/23/2012 Discharge date: 08/09/2012  Admission Diagnoses:  Discharge Diagnoses:  Principal Problem:  *Sepsis(995.91) Active Problems:  Acute blood loss anemia  Urinary tract infection  Tachycardia  Nausea & vomiting  Pleural effusion  Pneumonia  Ileus   Discharged Condition: good  Hospital Course: The patient underwent a right percutaneous nephrolithotomy for a 1.5 cm stone in the right renal pelvis. There were no immediate surgical complications the patient was noted to be tachycardic the next day. The patient's hemoglobin did drop from a pre operative level a 14 to approximately 10. The patient underwent imaging which revealed a perinephric bleed. The patient was transferred to the step down unit for more careful monitoring. Because of his significant tachycardia there was also concern over the possibility of a uroseptic picture. Vertical care medicine became involved in his care and he did have initiation of broad-spectrum antibiotic therapy. All cultures were negative. The patient did however develop a right-sided pleural effusion that we thought was reactive. That was drained and found to be bloody. Testing on the effusion however was unremarkable. The patient continued to drop his hemoglobin required multiple transfusions. He never was really hemodynamically unstable but did have a persistent tachycardia. The patient was sent to the anterior suite for an active bleeding vessel was not found. 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 The patient also had problematic intermittent gross hematuria with clot retention and required large  bore Foley catheters with continuous bladder irrigation. The patient's hemoglobin did stabilize. He was monitored for additional 72 hours and his hemoglobin increased. He remained afebrile and white blood cell count which got as high as 30,000 I have slowly normalized. The patient has been without significant complaints for the last several days we feel at this point is stable for discharge.  Consults: pulmonary/intensive care  Significant Diagnostic Studies: angiography: As well as multiple CT imaging studies as listed above and in the medical record.  Treatments: The patient underwent an initial right percutaneous nephrolithotomy on the day of admission. The patient underwent a thoracentesis to drain the pleural effusion and also underwent a catheterization to assess the renal arterial anatomy.  Discharge Exam: Blood pressure 117/75, pulse 102, temperature 99.5 F (37.5 C), temperature source Oral, resp. rate 16, height 5\' 9"  (1.753 m), weight 63.8 kg (140 lb 10.5 oz), SpO2 93.00%. Multiple well-nourished male in no acute distress. Cardiac: Regular rate and Pulmonary: Normal respiratory effort Abdomen: Well-healed midline incision soft nontender no CVA tenderness Genitourinary: Within normal limit extremities: No edema or tenderness.   Disposition: 01-Home or Self Care     Medication List     As of 08/09/2012  8:02 AM    TAKE these medications         HYDROcodone-acetaminophen 5-325 MG per tablet   Commonly known as: NORCO/VICODIN   Take 1-2 tablets by mouth every 6 (six) hours as needed for pain.           Follow-up Information    Please follow up. (We will call you)          Signed: Dandrea Medders S 08/09/2012, 8:02 AM

## 2012-08-23 ENCOUNTER — Other Ambulatory Visit (HOSPITAL_COMMUNITY): Payer: Self-pay | Admitting: Urology

## 2012-08-23 DIAGNOSIS — N200 Calculus of kidney: Secondary | ICD-10-CM

## 2012-09-03 ENCOUNTER — Ambulatory Visit (HOSPITAL_COMMUNITY)
Admission: RE | Admit: 2012-09-03 | Discharge: 2012-09-03 | Disposition: A | Discharge status: Home / Self Care | Payer: Self-pay | Source: Ambulatory Visit | Attending: Urology | Admitting: Urology

## 2012-09-03 DIAGNOSIS — N200 Calculus of kidney: Secondary | ICD-10-CM | POA: Insufficient documentation

## 2013-02-08 ENCOUNTER — Emergency Department (INDEPENDENT_AMBULATORY_CARE_PROVIDER_SITE_OTHER)
Admission: EM | Admit: 2013-02-08 | Discharge: 2013-02-08 | Disposition: A | Discharge status: Home / Self Care | Payer: Self-pay | Source: Home / Self Care

## 2013-02-08 ENCOUNTER — Encounter (HOSPITAL_COMMUNITY): Payer: Self-pay | Admitting: Emergency Medicine

## 2013-02-08 DIAGNOSIS — L0291 Cutaneous abscess, unspecified: Secondary | ICD-10-CM

## 2013-02-08 NOTE — ED Provider Notes (Signed)
Medical screening examination/treatment/procedure(s) were performed by resident physician or non-physician practitioner and as supervising physician I was immediately available for consultation/collaboration.   Chares Slaymaker DOUGLAS MD.   Breylin Dom D Kaston Faughn, MD 02/08/13 1955 

## 2013-02-08 NOTE — ED Notes (Signed)
Reports: bump on inside of buttocks. X 1 wk.   Denies drainage. Painful to touch.   Hot soak bath with no relief of symptoms.

## 2013-02-08 NOTE — ED Provider Notes (Signed)
Jared Mendez is a 42 y.o. male who presents to Urgent Care today for abscess right buttock. It was present for several days. It is worsening and now quite painful. He denies any fevers or chills. He denies any drainage. He has trouble sitting due to pain. He feels well otherwise.    PMH reviewed. CKD  History  Substance Use Topics  . Smoking status: Current Every Day Smoker -- 0.25 packs/day for 10 years    Types: Cigarettes  . Smokeless tobacco: Never Used  . Alcohol Use: Yes     Comment: rare   ROS as above Medications reviewed. No current facility-administered medications for this encounter.   Current Outpatient Prescriptions  Medication Sig Dispense Refill  . HYDROcodone-acetaminophen (NORCO/VICODIN) 5-325 MG per tablet Take 1-2 tablets by mouth every 6 (six) hours as needed for pain.  20 tablet  0    Exam:  BP 112/80  Pulse 73  Temp(Src) 97.6 F (36.4 C) (Oral)  Resp 20  SpO2 100% Gen: Well NAD Skin: Quarter-sized fluctuant abscess on right buttock.  Tender with surrounding induration extending approximately 2 cm in circumference.  Normal otherwise   Procedure note abscess incision and drainage Consent obtained and timeout performed. Area cleaned with alcohol. 4 mL of 2% lidocaine with epinephrine injected around the abscess.  A scalpel is used to sharply in size the fluctuant area and 1 mL of pus was expressed. A Q-tip was used to debride the remaining abscess. Several inches of quarter inch packing were inserted into the abscess and a dressing was applied.   Assessment and Plan: 42 y.o. male with abscess incision and drainage.  Abscess was incised and drained. No empiric treatment antibiotics as no significant cellulitis.  Dressing changes and packing removal as outlined in discharge summary.  Followup as needed.      Rodolph Bong, MD 02/08/13 [phone]

## 2013-12-04 IMAGING — XA IR PERC INTRO URET CATH*R*
10 of 15 series · 13 of 21 positions shown · non-contrast
Comparison: CT of the abdomen and pelvis - 11/21/2011;

INDICATION: Renal stones, access for right-sided percutaneous
nephrolithotomy.

1.  FLUOROSCOPIC GUIDANCE FOR PUNCTURE OF THE RIGHT RENAL
COLLECTING SYSTEM.
2. RIGHT SIDED PERCUTANEOUS NEPHROSTOMY TUBE PLACEMENT

[Series 2: care single · 1 of 1 slices shown (1 of 9)]
[im 1/1]
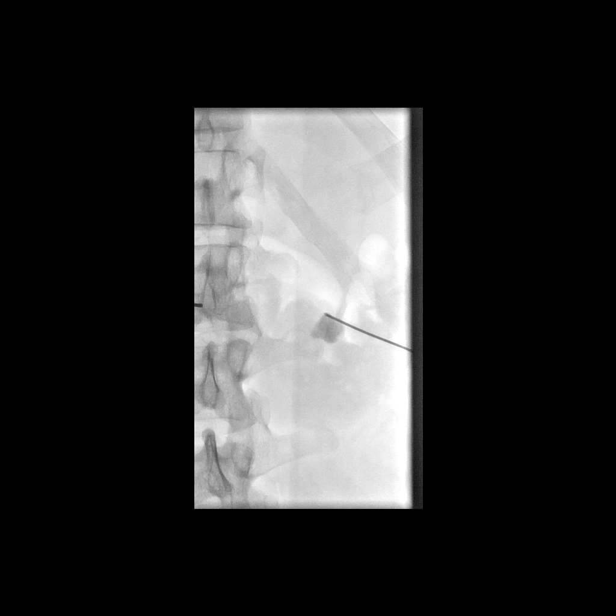

[Series 4: care single · 1 of 1 slices shown (2 of 9)]
[im 1/1]
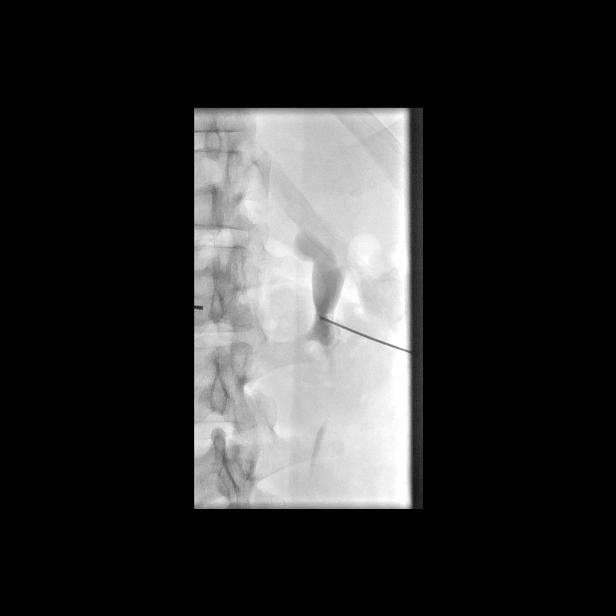

[Series 7: care single · 1 of 1 slices shown (3 of 9)]
[im 1/1]
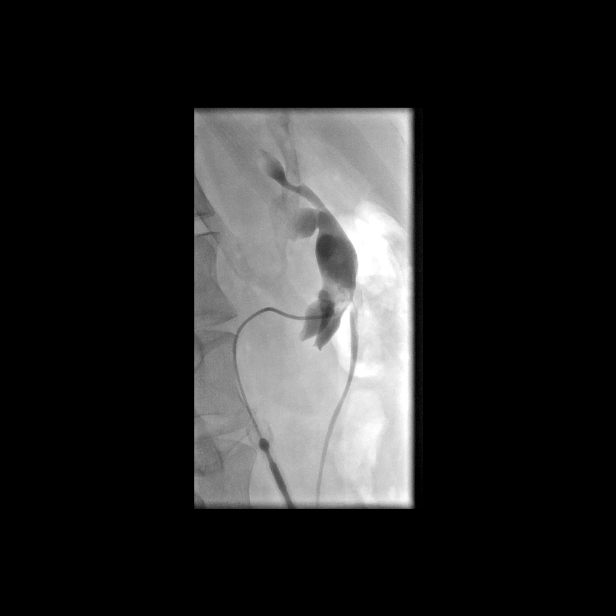

[Series 8: care single · 1 of 1 slices shown (4 of 9)]
[im 1/1]
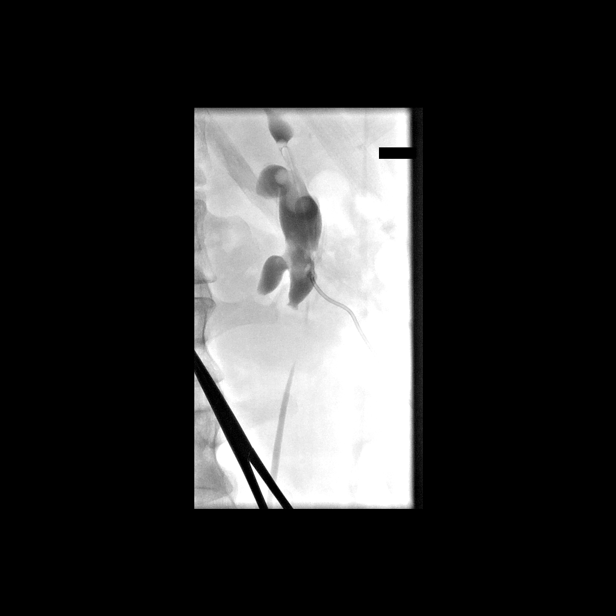

[Series 10: care single · 1 of 1 slices shown (5 of 9)]
[im 1/1]
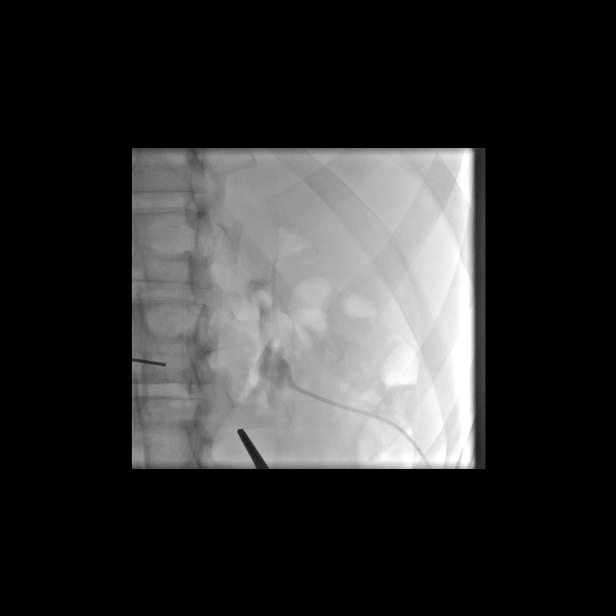

[Series 11: care single · 1 of 1 slices shown (6 of 9)]
[im 1/1]
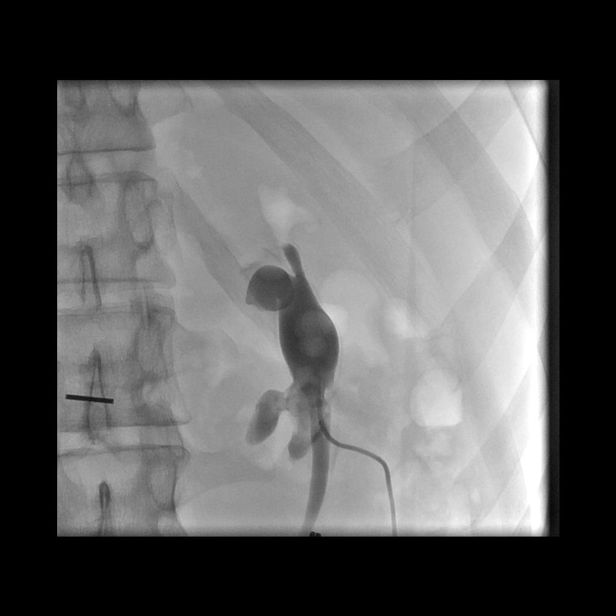

[Series 15: care single · 1 of 1 slices shown (7 of 9)]
[im 1/1]
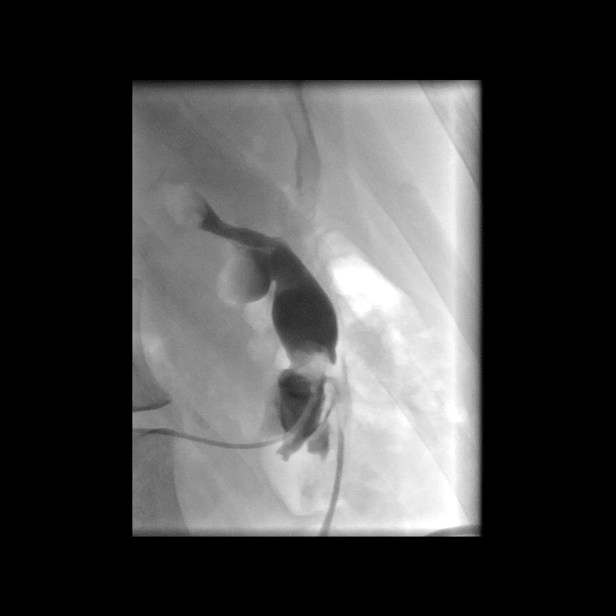

[Series 19: care single · 1 of 1 slices shown (8 of 9)]
[im 1/1]
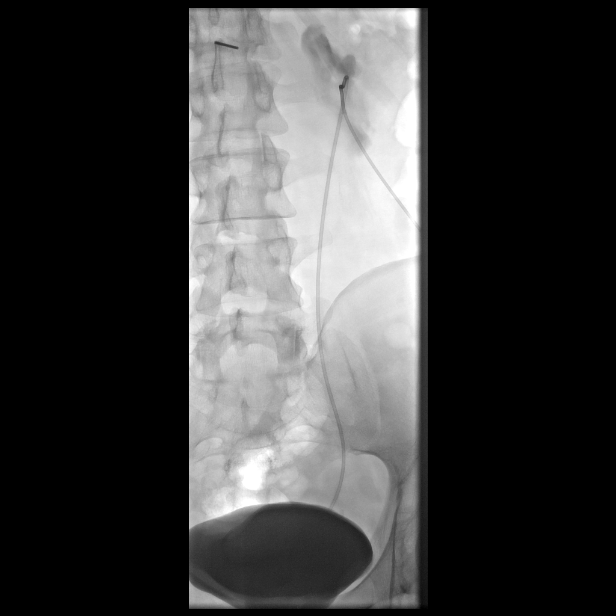

[Series 20: care single · 1 of 1 slices shown (9 of 9)]
[im 1/1]
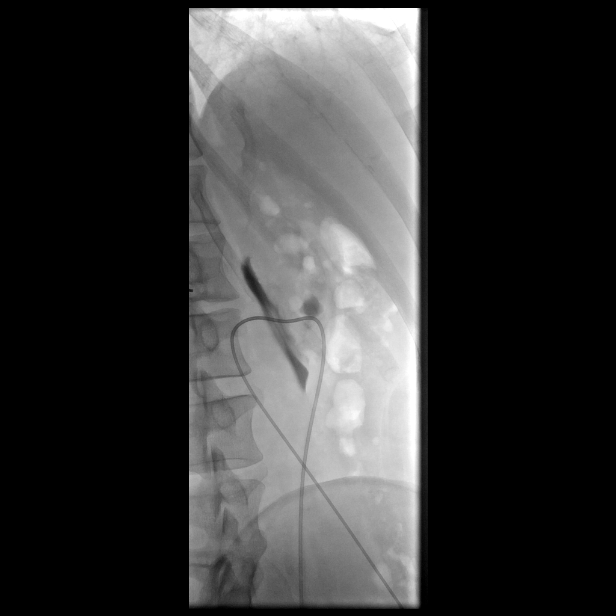

[Series 100: ir intro uret cath perc *r* · 4 of 7 slices shown]
[im 2/7]
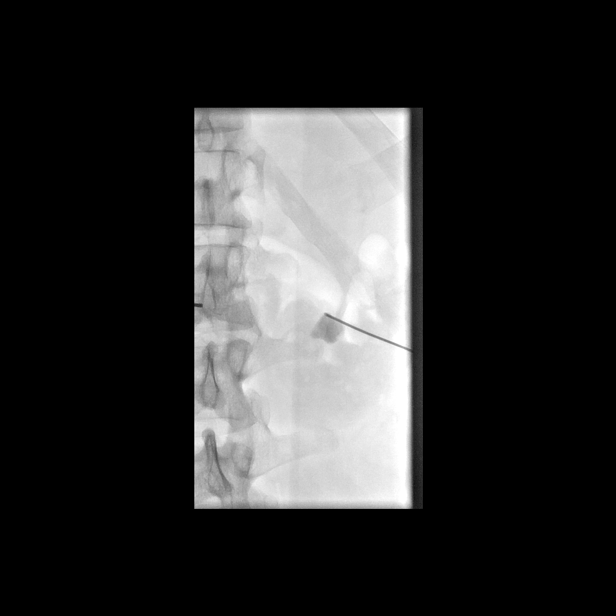
[im 3/7]
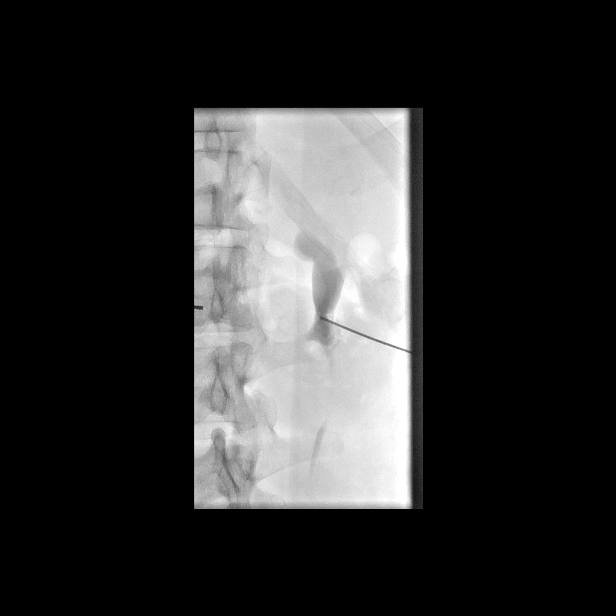
[im 5/7]
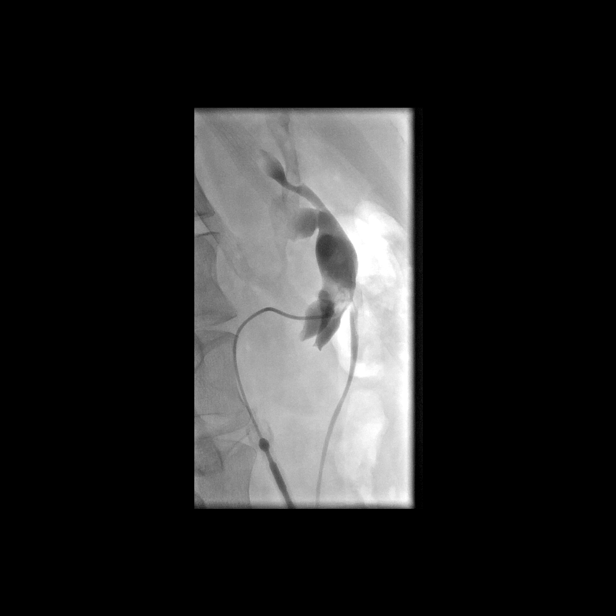
[im 7/7]
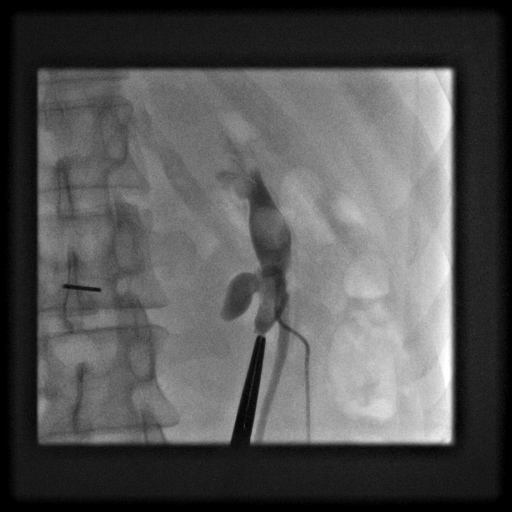

[13 of 21 positions shown; findings below may reference images not displayed]

abdominal
radiograph - 05/15/2012

Intravenous medications: Fentanyl 300 mcg IV; Versed 3 mg IV;
Ciprofloxacin 400 mg IV; The antibiotic was administered in an
appropriate time frame prior to skin puncture.

Total Moderate Sedation Time: 45 minutes.

Contrast: 50 mL 2mnipaque-QEE (administered into the collecting
system)

Fluoroscopy Time: 19.7 minutes.

Complications: None immediate

Procedure:

Informed written consent was obtained from the patient after a
discussion of the risks, benefits, and alternatives to treatment.
The right flank region was prepped with Betadine in a sterile
fashion, and a sterile drape was applied covering the operative
field.  A sterile gown and sterile gloves were used for the
procedure.  A timeout was performed prior to the initiation of the
procedure.

A pre procedural spot fluoroscopic image was obtained of the upper
abdomen.  Ultrasound scanning performed of the kidney was negative
for significant hydronephrosis.  As such, the stone within inferior
aspect of the right kidney was targeted fluoroscopically with a 22
gauge Chiba needle.  Access to the collecting system was confirmed
with advancement of a Nitrex wire into the collecting system.  The
needle was exchanged for the inner 3 French catheter from an
Accustick set and contrast injection confirmed access.   A small
amount of air was injected into the collecting system to help
delineate a posterior calyx.  A posterior inferior calyx was
targeted with a 22 gauge Chiba needle.  Access to the calyx was
confirmed with advancement of a Nitrex wire into the collecting
system.  An Accustick set was utilized to dilate the tract and was
subsequently exchanged for a Kumpe catheter over a Bentson wire.
The angled Rim catheter was advanced down the ureter and into the
urinary bladder.  Postprocedural spot radiographs were obtained in
various obliquities and the catheter was sutured to the skin.  The
catheter was capped and a dressing was placed.  The patient
tolerated the procedure well without immediate postprocedural
complication.
FINDINGS: Pre procedural spot radiographic images demonstrates grossly
unchanged appearance of these approximately 1.0 x 1.1 cm stone
overlying the expected location of the inferior pole of the right
kidney as demonstrated on recent abdominal CT.  Ultrasound scanning
was negative for significant hydronephrosis and as such, the stone
was targeted fluoroscopically allowing access to the collecting
system.

Post contrast injection of the collecting system demonstrates
lateral rotation of the right kidney with the renal pelvis and UPJ
positioned laterally as demonstrated on preprocedural abdominal CT.
The stone appears in an indeterminate location, possibly within the
inferior aspect of a nondilated renal pelvis versus the
infundibulum supplying the posterior renal calyces.

Given the malpositioning of the right kidney, a slightly medial
approach was utilized to access a now opacified posterior inferior
calix via fluoroscopic guidance.  Ultimately a Rim angled catheter
was advanced through this calix with tip ultimately terminating
within the urinary bladder.
IMPRESSION: 1.  Successful fluoroscopic guided placement of a right sided 5
French angled Rim catheter to the level of the urinary bladder to
be utilized during impending nephrolithotomy procedure.

2.  Malpositioned right kidney with renal pelvis directed
laterally.  The existing renal stone is in an indeterminate
location, either within the inferior aspect of a nondilated renal
pelvis or the infundibulum supplying the inferior calyces.

Above findings discussed and reviewed with Dr. Audunn at the time
of procedure completion.

## 2013-12-05 IMAGING — CT CT ABD-PELV W/O CM
2 of 4 series · 16 of 46 positions shown, 18 images · non-contrast
Comparison: 07/23/2012 percutaneous nephrostomy, 11/21/2011 CT.

CLINICAL DATA: Right flank pain

CT ABDOMEN AND PELVIS WITHOUT CONTRAST
TECHNIQUE: Multidetector CT imaging of the abdomen and pelvis was
performed following the standard protocol without intravenous
contrast.

[Series 2: rtn a/p w/o · axial · non-contrast · 0.66mm/px · z∈[-378,+16]mm · 13 of 87 slices shown, 15 images]
[im 4/87  soft-tissue]
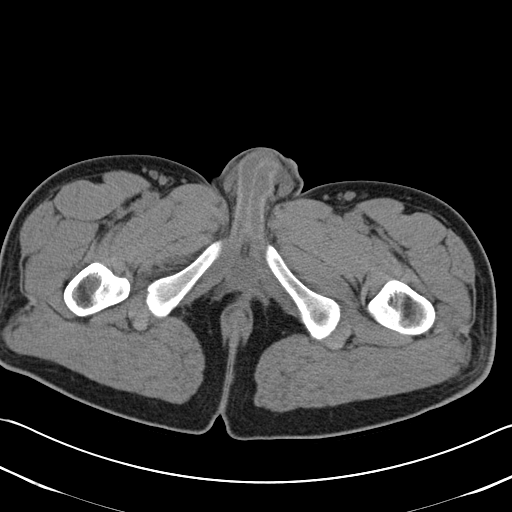
[im 4/87  bone]
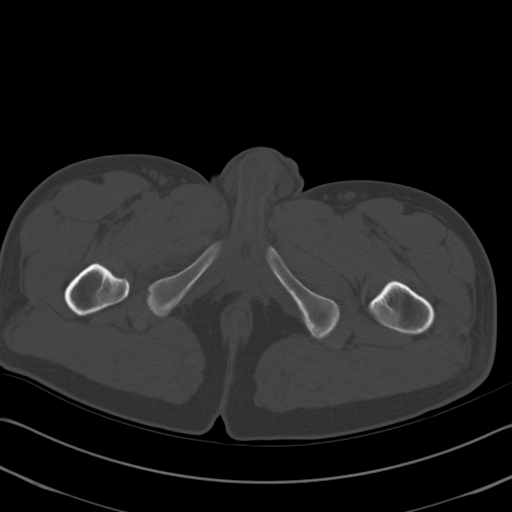
[im 10/87  soft-tissue]
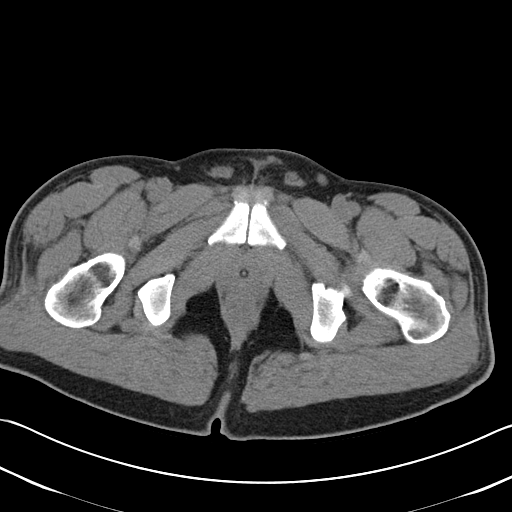
[im 17/87  soft-tissue]
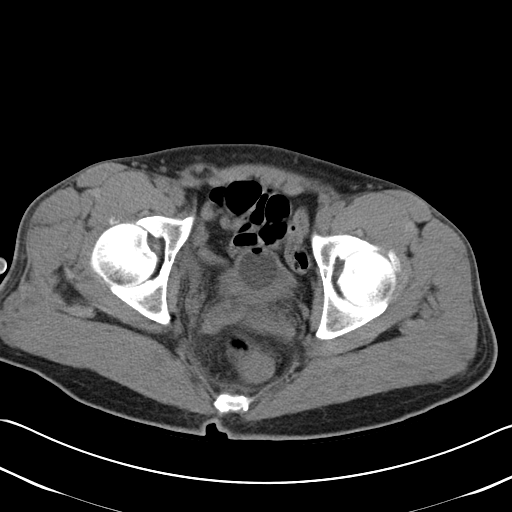
[im 24/87  soft-tissue]
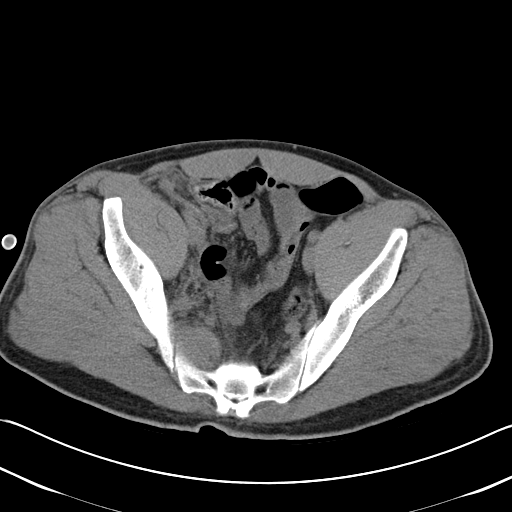
[im 30/87  soft-tissue]
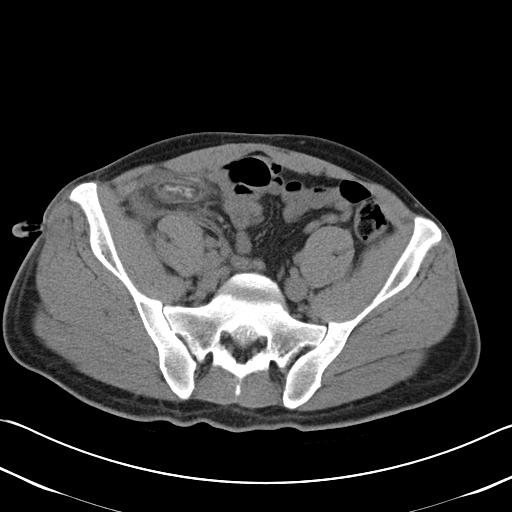
[im 37/87  soft-tissue]
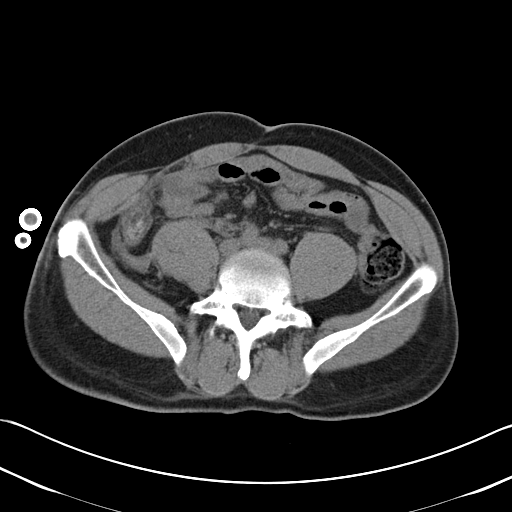
[im 44/87  soft-tissue]
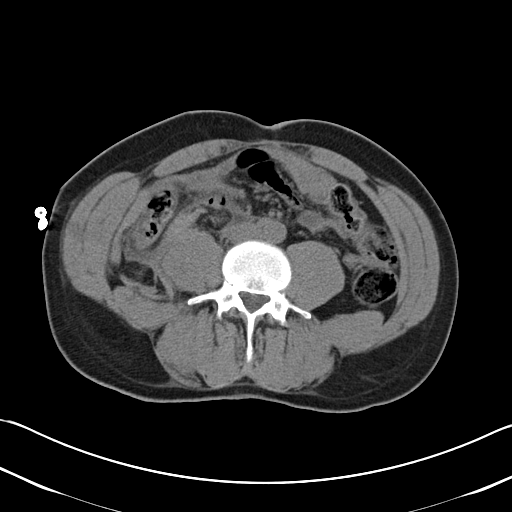
[im 50/87  soft-tissue]
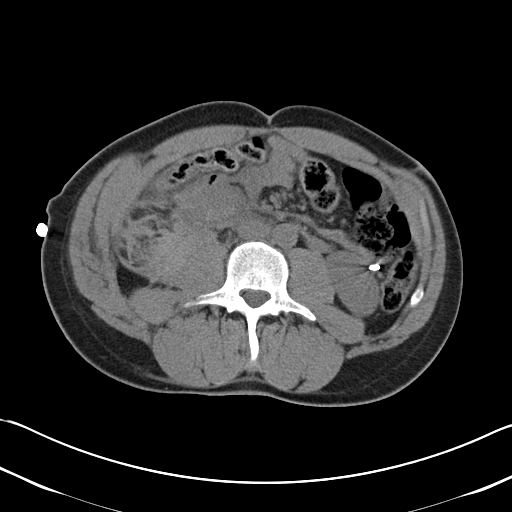
[im 57/87  soft-tissue]
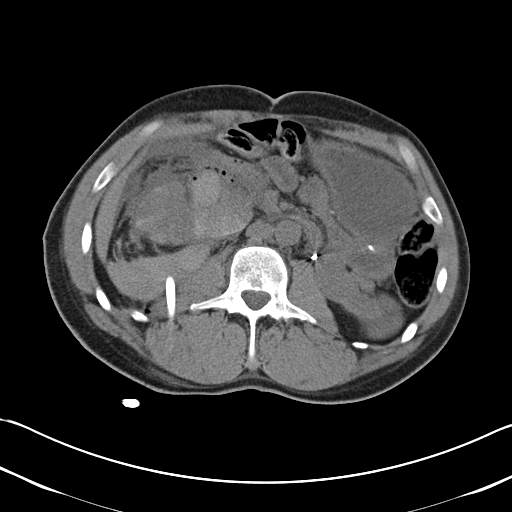
[im 57/87  bone]
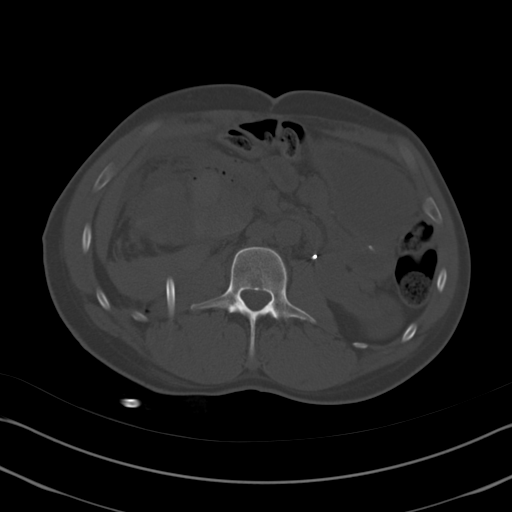
[im 63/87  soft-tissue]
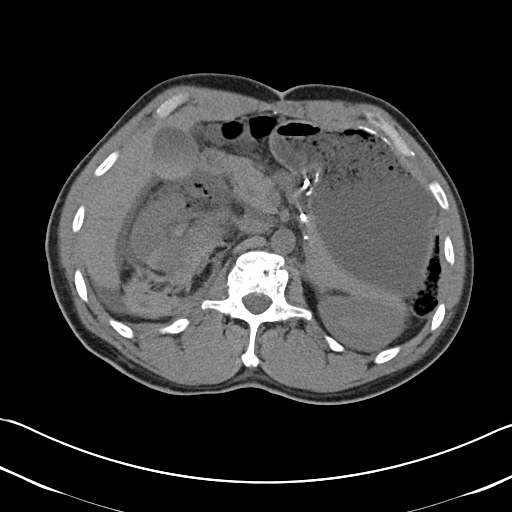
[im 70/87  soft-tissue]
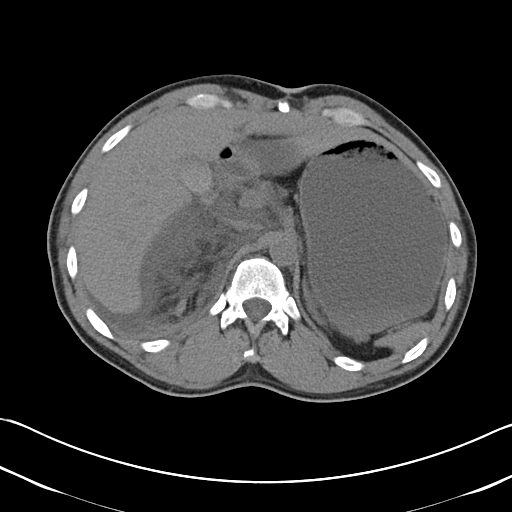
[im 77/87  soft-tissue]
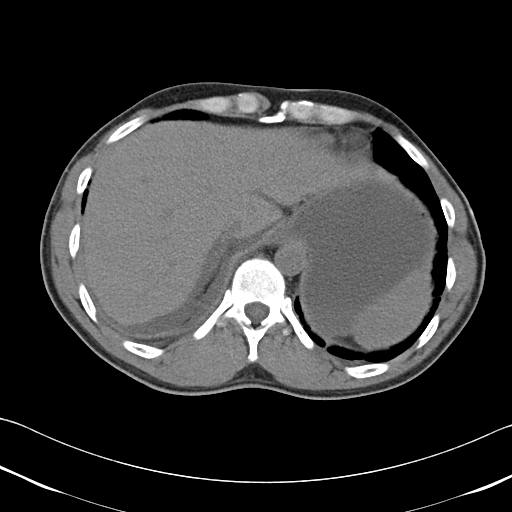
[im 83/87  soft-tissue]
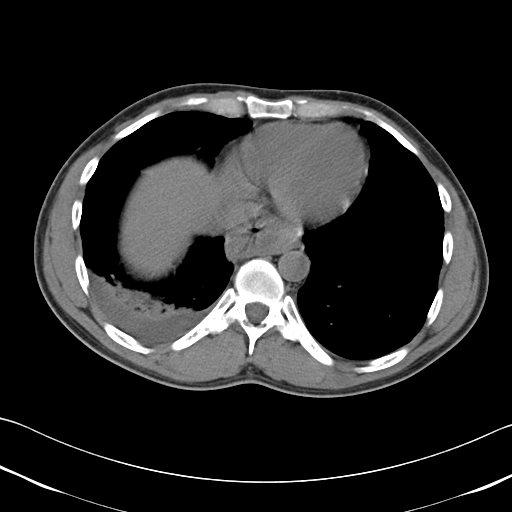

[Series 602: <mpr thick range> · coronal · 0.85mm/px · 3 of 72 slices shown]
[im 24/72  soft-tissue]
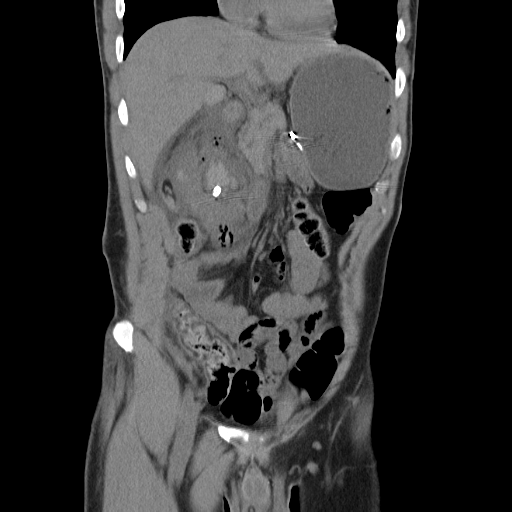
[im 32/72  soft-tissue]
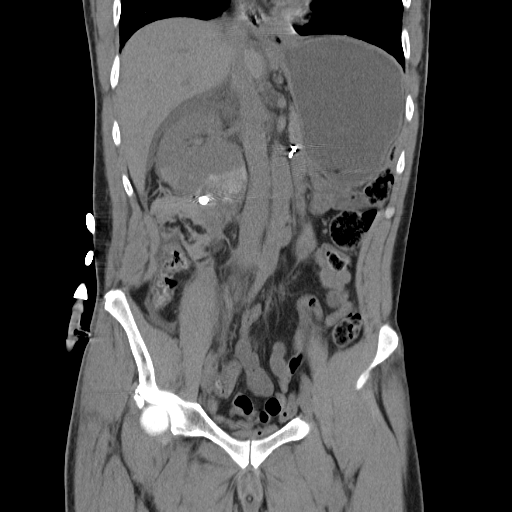
[im 40/72  soft-tissue]
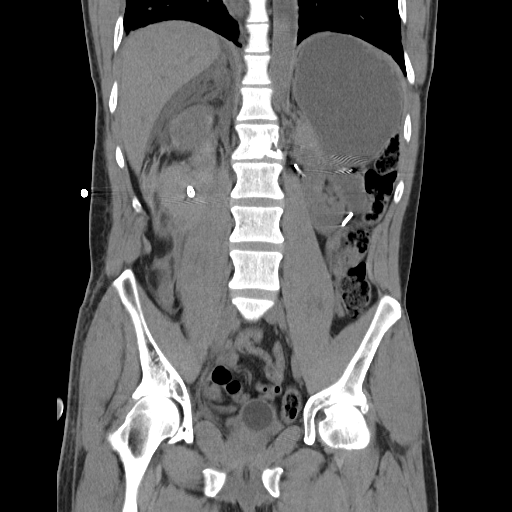

[16 of 46 positions shown; findings below may reference images not displayed]

FINDINGS: Small right pleural effusion.  Trace left pleural
effusion.  Associated consolidations.  Moderate hiatal hernia.
Normal heart size.

Organ abnormality/lesion detection is limited in the absence of
intravenous contrast. Within this limitation, unremarkable liver,
spleen, pancreas, left adrenal gland.  Right adrenal gland is
difficult to localize.  High attenuation within the gallbladder
presumably represents vicarious excretion.  No biliary ductal
dilatation.  Unchanged appearance to the left kidney with
duplication and rotation anomaly.  Surgical suture along the
stomach and left upper quadrant.

The right kidney is rotated and heterogeneous in attenuation.
Percutaneous nephrostomy tube in place.  The tube balloon is
positioned within the lower pole cortex.  There is high attenuation
within the renal pelvis as well as locules of gas which extend
inferiorly along the course of the right ureter. No residual stone
fragments identified.  There is a 3.1 x 7.1 cm hematoma within the
right posterior pararenal space.  Small amount of blood products
extends inferiorly within the right paracolic gutter and anterior
to the psoas.  There is a simple appearing perihepatic fluid.

No bowel obstruction.  No CT evidence for colitis.  No free
intraperitoneal air.  No lymphadenopathy.  Limited vascular
evaluation without intravenous contrast.  No aneurysmal dilatation.

Decompressed bladder with a Foley catheter balloon in place.  No
pelvic lymphadenopathy.

L1 vertebral body deformity is post traumatic.  No acute osseous
finding.
IMPRESSION: Status post right percutaneous nephrostomy.  The nephrostomy tube
tip is within the collecting system however the balloon appears to
be within the cortex.

The right renal parenchyma is heterogeneous in attenuation which
may reflect areas of retained contrast. Attention at follow-up.

Hematoma within the right posterior pararenal space as above.

High attenuation within the right collecting system is in keeping
with retained contrast or blood products. No residual stone
fragments identified.

Small right pleural effusion with associated consolidation, likely
atelectasis.

Discussed via telephone with Dr. Gilson at [DATE] a.m. on 07/24/2012.

## 2014-03-11 ENCOUNTER — Encounter (INDEPENDENT_AMBULATORY_CARE_PROVIDER_SITE_OTHER): Payer: PRIVATE HEALTH INSURANCE | Admitting: Surgery

## 2014-03-12 ENCOUNTER — Encounter (INDEPENDENT_AMBULATORY_CARE_PROVIDER_SITE_OTHER): Payer: Self-pay | Admitting: General Surgery

## 2014-03-12 ENCOUNTER — Ambulatory Visit (INDEPENDENT_AMBULATORY_CARE_PROVIDER_SITE_OTHER): Payer: PRIVATE HEALTH INSURANCE | Admitting: General Surgery

## 2014-03-12 VITALS — BP 126/74 | HR 75 | Temp 97.8°F | Ht 67.0 in | Wt 147.0 lb

## 2014-03-12 DIAGNOSIS — L0591 Pilonidal cyst without abscess: Secondary | ICD-10-CM

## 2014-03-12 MED ORDER — AMOXICILLIN-POT CLAVULANATE 875-125 MG PO TABS: 1.0000 | ORAL_TABLET | Freq: Two times a day (BID) | ORAL | Status: AC

## 2014-03-12 NOTE — Assessment & Plan Note (Signed)
Patient appears to have had a pilonidal abscess. This appears to be resolved. He now appears to have a cyst with a tiny sinus tract. I would not recommend excising it at this time.  I advised him to continue antibiotics for 2 more weeks. He is to keep the area clean and dry. He'll follow up on an as-needed basis.
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
## 2014-03-12 NOTE — Patient Instructions (Signed)
Pilonidal Cyst A pilonidal cyst occurs when hairs get trapped (ingrown) beneath the skin in the crease between the buttocks over your sacrum (the bone under that crease). Pilonidal cysts are most common in young men with a lot of body hair. When the cyst is ruptured (breaks) or leaking, fluid from the cyst may cause burning and itching. If the cyst becomes infected, it causes a painful swelling filled with pus (abscess). The pus and trapped hairs need to be removed (often by lancing) so that the infection can heal. However, recurrence is common and an operation may be needed to remove the cyst. HOME CARE INSTRUCTIONS   If the cyst was NOT INFECTED:  Keep the area clean and dry. Bathe or shower daily. Wash the area well with a germ-killing soap. Warm tub baths may help prevent infection and help with drainage. Dry the area well with a towel.  Avoid tight clothing to keep area as moisture free as possible.  Keep area between buttocks as free of hair as possible. A depilatory may be used.  If the cyst WAS INFECTED and needed to be drained:  Your caregiver packed the wound with gauze to keep the wound open. This allows the wound to heal from the inside outwards and continue draining.  Return for a wound check in 1 day or as suggested.  If you take tub baths or showers, repack the wound with gauze following them. Sponge baths (at the sink) are a good alternative.  If an antibiotic was ordered to fight the infection, take as directed.  Only take over-the-counter or prescription medicines for pain, discomfort, or fever as directed by your caregiver.  After the drain is removed, use sitz baths for 20 minutes 4 times per day. Clean the wound gently with mild unscented soap, pat dry, and then apply a dry dressing. SEEK MEDICAL CARE IF:   You have increased pain, swelling, redness, drainage, or bleeding from the area.  You have a fever.  You have muscles aches, dizziness, or a general ill  feeling. Document Released: 10/14/2000 Document Revised: 01/09/2012 Document Reviewed: 12/12/2008 [employer] Patient Information 2014 [employer].  

## 2016-01-05 ENCOUNTER — Emergency Department (INDEPENDENT_AMBULATORY_CARE_PROVIDER_SITE_OTHER): Payer: Self-pay

## 2016-01-05 ENCOUNTER — Encounter (HOSPITAL_COMMUNITY): Payer: Self-pay

## 2016-01-05 ENCOUNTER — Emergency Department (INDEPENDENT_AMBULATORY_CARE_PROVIDER_SITE_OTHER)
Admission: EM | Admit: 2016-01-05 | Discharge: 2016-01-05 | Disposition: A | Discharge status: Home / Self Care | Payer: Self-pay | Source: Home / Self Care | Attending: Family Medicine | Admitting: Family Medicine

## 2016-01-05 DIAGNOSIS — J069 Acute upper respiratory infection, unspecified: Secondary | ICD-10-CM

## 2016-01-05 DIAGNOSIS — T148 Other injury of unspecified body region: Secondary | ICD-10-CM

## 2016-01-05 DIAGNOSIS — W57XXXA Bitten or stung by nonvenomous insect and other nonvenomous arthropods, initial encounter: Secondary | ICD-10-CM

## 2016-01-05 MED ORDER — PREDNISONE 50 MG PO TABS: 50.0000 mg | ORAL_TABLET | Freq: Every day | ORAL | Status: DC

## 2016-01-05 NOTE — ED Notes (Signed)
Pt states he has been having back pain x1 month And he states he is having congestion and cold symptoms, pt has been taking alka seltzer cold/flu No acute distress
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
## 2016-01-05 NOTE — Discharge Instructions (Signed)
Insect Bite °Mosquitoes, flies, fleas, bedbugs, and other insects can bite. Insect bites are different from insect stings. The bite may be red, puffy (swollen), and itchy for 2 to 4 days. Most bites get better on their own. °HOME CARE  °· Do not scratch the bite. °· Keep the bite clean and dry. Wash the bite with soap and water every day, as told by your doctor. °· If directed, apply ice to the bite area. °¨ Put ice in a plastic bag. °¨ Place a towel between your skin and the bag. °¨ Leave the ice on for 20 minutes, 2-3 times per day. °· Follow instructions from your doctor about using medicated lotions or creams. These can help with itching. °· Apply or take over-the-counter and prescription medicines only as told by your doctor. °· If you were given an antibiotic medicine, use it as told by your doctor. Do not stop using the medicine even if your condition improves. °· Keep all follow-up visits as told by your doctor. This is important. °GET HELP IF: °· You have redness, swelling (inflammation), or pain near your bite that is getting worse. °· You have a fever. °GET HELP RIGHT AWAY IF:  °· You have joint pain.   °· You have fluid, blood, or pus coming from the bite area.   °· You have a headache. °· You have neck pain. °· You feel weaker than you normally do.   °· You have a rash.   °· You have chest pain. °· You have shortness of breath. °· You have stomach pain, feel sick to your stomach (nauseous), or throw up (vomit). °· You feel more tired or sleepy than you normally do. °  °This information is not intended to replace advice given to you by your health care provider. Make sure you discuss any questions you have with your health care provider. °  °Document Released: 10/14/2000 Document Revised: 07/08/2015 Document Reviewed: 03/04/2015 °Elsevier Interactive Patient Education ©2016 [employer] ° °

## 2016-01-12 ENCOUNTER — Emergency Department (HOSPITAL_COMMUNITY)
Admission: EM | Admit: 2016-01-12 | Discharge: 2016-01-12 | Disposition: A | Discharge status: Home / Self Care | Payer: Self-pay | Attending: Emergency Medicine | Admitting: Emergency Medicine

## 2016-01-12 ENCOUNTER — Encounter (HOSPITAL_COMMUNITY): Payer: Self-pay

## 2016-01-12 DIAGNOSIS — F17210 Nicotine dependence, cigarettes, uncomplicated: Secondary | ICD-10-CM | POA: Insufficient documentation

## 2016-01-12 DIAGNOSIS — G479 Sleep disorder, unspecified: Secondary | ICD-10-CM

## 2016-01-12 DIAGNOSIS — J069 Acute upper respiratory infection, unspecified: Secondary | ICD-10-CM | POA: Insufficient documentation

## 2016-01-12 DIAGNOSIS — N189 Chronic kidney disease, unspecified: Secondary | ICD-10-CM | POA: Insufficient documentation

## 2016-01-12 DIAGNOSIS — Z87828 Personal history of other (healed) physical injury and trauma: Secondary | ICD-10-CM | POA: Insufficient documentation

## 2016-01-12 DIAGNOSIS — R51 Headache: Secondary | ICD-10-CM

## 2016-01-12 DIAGNOSIS — Z79899 Other long term (current) drug therapy: Secondary | ICD-10-CM | POA: Insufficient documentation

## 2016-01-12 DIAGNOSIS — R519 Headache, unspecified: Secondary | ICD-10-CM

## 2016-01-12 DIAGNOSIS — G478 Other sleep disorders: Secondary | ICD-10-CM | POA: Insufficient documentation

## 2016-01-12 MED ORDER — BENZONATATE 100 MG PO CAPS: 100.0000 mg | ORAL_CAPSULE | Freq: Three times a day (TID) | ORAL | Status: DC | PRN

## 2016-01-12 MED ORDER — CETIRIZINE HCL 10 MG PO TABS: 10.0000 mg | ORAL_TABLET | Freq: Every day | ORAL | Status: DC

## 2016-01-12 MED ORDER — DIPHENHYDRAMINE HCL 25 MG PO TABS: 25.0000 mg | ORAL_TABLET | Freq: Four times a day (QID) | ORAL | Status: DC | PRN

## 2016-01-12 NOTE — ED Notes (Signed)
PT DISCHARGED. INSTRUCTIONS AND PRESCRIPTIONS GIVEN. AAOX3. PT IN NO APPARENT DISTRESS OR PAIN. THE OPPORTUNITY TO ASK QUESTIONS WAS PROVIDED. 

## 2016-01-12 NOTE — Discharge Instructions (Signed)
Taking prednisone has made you feel jittery and anxious. Please stop taking it. Use benadryl to help you sleep tonight.   General Headache Without Cause A headache is pain or discomfort felt around the head or neck area. The specific cause of a headache may not be found. There are many causes and types of headaches. A few common ones are:  Tension headaches.  Migraine headaches.  Cluster headaches.  Chronic daily headaches. HOME CARE INSTRUCTIONS  Watch your condition for any changes. Take these steps to help with your condition: Managing Pain  Take over-the-counter and prescription medicines only as told by your health care provider.  Lie down in a dark, quiet room when you have a headache.  If directed, apply ice to the head and neck area:  Put ice in a plastic bag.  Place a towel between your skin and the bag.  Leave the ice on for 20 minutes, 2-3 times per day.  Use a heating pad or hot shower to apply heat to the head and neck area as told by your health care provider.  Keep lights dim if bright lights bother you or make your headaches worse. Eating and Drinking  Eat meals on a regular schedule.  Limit alcohol use.  Decrease the amount of caffeine you drink, or stop drinking caffeine. General Instructions  Keep all follow-up visits as told by your health care provider. This is important.  Keep a headache journal to help find out what may trigger your headaches. For example, write down:  What you eat and drink.  How much sleep you get.  Any change to your diet or medicines.  Try massage or other relaxation techniques.  Limit stress.  Sit up straight, and do not tense your muscles.  Do not use tobacco products, including cigarettes, chewing tobacco, or e-cigarettes. If you need help quitting, ask your health care provider.  Exercise regularly as told by your health care provider.  Sleep on a regular schedule. Get 7-9 hours of sleep, or the amount  recommended by your health care provider. SEEK MEDICAL CARE IF:   Your symptoms are not helped by medicine.  You have a headache that is different from the usual headache.  You have nausea or you vomit.  You have a fever. SEEK IMMEDIATE MEDICAL CARE IF:   Your headache becomes severe.  You have repeated vomiting.  You have a stiff neck.  You have a loss of vision.  You have problems with speech.  You have pain in the eye or ear.  You have muscular weakness or loss of muscle control.  You lose your balance or have trouble walking.  You feel faint or pass out.  You have confusion.   This information is not intended to replace advice given to you by your health care provider. Make sure you discuss any questions you have with your health care provider.   Document Released: 10/17/2005 Document Revised: 07/08/2015 Document Reviewed: 02/09/2015 Elsevier Interactive Patient Education 2016 [employer] Upper Respiratory Infection, Adult Most upper respiratory infections (URIs) are a viral infection of the air passages leading to the lungs. A URI affects the nose, throat, and upper air passages. The most common type of URI is nasopharyngitis and is typically referred to as "the common cold." URIs run their course and usually go away on their own. Most of the time, a URI does not require medical attention, but sometimes a bacterial infection in the upper airways can follow a viral infection. This is  called a secondary infection. Sinus and middle ear infections are common types of secondary upper respiratory infections. Bacterial pneumonia can also complicate a URI. A URI can worsen asthma and chronic obstructive pulmonary disease (COPD). Sometimes, these complications can require emergency medical care and may be life threatening.  CAUSES Almost all URIs are caused by viruses. A virus is a type of germ and can spread from one person to another.  RISKS FACTORS You may be at risk for  a URI if:   You smoke.   You have chronic heart or lung disease.  You have a weakened defense (immune) system.   You are very young or very old.   You have nasal allergies or asthma.  You work in crowded or poorly ventilated areas.  You work in health care facilities or schools. SIGNS AND SYMPTOMS  Symptoms typically develop 2-3 days after you come in contact with a cold virus. Most viral URIs last 7-10 days. However, viral URIs from the influenza virus (flu virus) can last 14-18 days and are typically more severe. Symptoms may include:   Runny or stuffy (congested) nose.   Sneezing.   Cough.   Sore throat.   Headache.   Fatigue.   Fever.   Loss of appetite.   Pain in your forehead, behind your eyes, and over your cheekbones (sinus pain).  Muscle aches.  DIAGNOSIS  Your health care provider may diagnose a URI by:  Physical exam.  Tests to check that your symptoms are not due to another condition such as:  Strep throat.  Sinusitis.  Pneumonia.  Asthma. TREATMENT  A URI goes away on its own with time. It cannot be cured with medicines, but medicines may be prescribed or recommended to relieve symptoms. Medicines may help:  Reduce your fever.  Reduce your cough.  Relieve nasal congestion. HOME CARE INSTRUCTIONS   Take medicines only as directed by your health care provider.   Gargle warm saltwater or take cough drops to comfort your throat as directed by your health care provider.  Use a warm mist humidifier or inhale steam from a shower to increase air moisture. This may make it easier to breathe.  Drink enough fluid to keep your urine clear or pale yellow.   Eat soups and other clear broths and maintain good nutrition.   Rest as needed.   Return to work when your temperature has returned to normal or as your health care provider advises. You may need to stay home longer to avoid infecting others. You can also use a face mask and  careful hand washing to prevent spread of the virus.  Increase the usage of your inhaler if you have asthma.   Do not use any tobacco products, including cigarettes, chewing tobacco, or electronic cigarettes. If you need help quitting, ask your health care provider. PREVENTION  The best way to protect yourself from getting a cold is to practice good hygiene.   Avoid oral or hand contact with people with cold symptoms.   Wash your hands often if contact occurs.  There is no clear evidence that vitamin C, vitamin E, echinacea, or exercise reduces the chance of developing a cold. However, it is always recommended to get plenty of rest, exercise, and practice good nutrition.  SEEK MEDICAL CARE IF:   You are getting worse rather than better.   Your symptoms are not controlled by medicine.   You have chills.  You have worsening shortness of breath.  You have brown  or red mucus.  You have yellow or brown nasal discharge.  You have pain in your face, especially when you bend forward.  You have a fever.  You have swollen neck glands.  You have pain while swallowing.  You have white areas in the back of your throat. SEEK IMMEDIATE MEDICAL CARE IF:   You have severe or persistent:  Headache.  Ear pain.  Sinus pain.  Chest pain.  You have chronic lung disease and any of the following:  Wheezing.  Prolonged cough.  Coughing up blood.  A change in your usual mucus.  You have a stiff neck.  You have changes in your:  Vision.  Hearing.  Thinking.  Mood. MAKE SURE YOU:   Understand these instructions.  Will watch your condition.  Will get help right away if you are not doing well or get worse.   This information is not intended to replace advice given to you by your health care provider. Make sure you discuss any questions you have with your health care provider.   Document Released: 04/12/2001 Document Revised: 03/03/2015 Document Reviewed:  01/22/2014 Elsevier Interactive Patient Education [employer]

## 2016-01-12 NOTE — ED Notes (Signed)
NOT ANSWERING FOR VITALS RECHECK 

## 2016-01-12 NOTE — ED Notes (Addendum)
Pt with insomnia, headache x 2 days.  Eating/drinking ok.  No n/v.  No fever.  Chest cough.  Pt came off steroids on Sunday night.  Recently treated for bites?
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
## 2017-04-05 ENCOUNTER — Ambulatory Visit (HOSPITAL_COMMUNITY)
Admission: EM | Admit: 2017-04-05 | Discharge: 2017-04-05 | Disposition: A | Discharge status: Home / Self Care | Payer: Self-pay | Attending: Internal Medicine | Admitting: Internal Medicine

## 2017-04-05 ENCOUNTER — Encounter (HOSPITAL_COMMUNITY): Payer: Self-pay | Admitting: *Deleted

## 2017-04-05 DIAGNOSIS — Z202 Contact with and (suspected) exposure to infections with a predominantly sexual mode of transmission: Secondary | ICD-10-CM | POA: Insufficient documentation

## 2017-04-05 DIAGNOSIS — Z711 Person with feared health complaint in whom no diagnosis is made: Secondary | ICD-10-CM

## 2017-04-05 DIAGNOSIS — N189 Chronic kidney disease, unspecified: Secondary | ICD-10-CM | POA: Insufficient documentation

## 2017-04-05 DIAGNOSIS — F17210 Nicotine dependence, cigarettes, uncomplicated: Secondary | ICD-10-CM | POA: Insufficient documentation

## 2017-04-05 DIAGNOSIS — Z79899 Other long term (current) drug therapy: Secondary | ICD-10-CM | POA: Insufficient documentation

## 2017-04-05 MED ORDER — AZITHROMYCIN 250 MG PO TABS
ORAL_TABLET | ORAL | Status: AC
  Filled 2017-04-05: qty 4

## 2017-04-05 MED ORDER — AZITHROMYCIN 250 MG PO TABS
1000.0000 mg | ORAL_TABLET | Freq: Once | ORAL | Status: AC
  Administered 2017-04-05: 1000 mg via ORAL

## 2017-04-05 NOTE — Discharge Instructions (Signed)
You are being treated for possible exposure to Trichomonas. Your medicine has been administered here. If you develop painful blisters on her genitals that indicates likelihood of genital herpes. That is the point in which she will need to be treated.
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
## 2017-04-06 LAB — URINE CYTOLOGY ANCILLARY ONLY
Chlamydia: NEGATIVE
Neisseria Gonorrhea: NEGATIVE
Trichomonas: NEGATIVE

## 2017-04-22 ENCOUNTER — Ambulatory Visit (HOSPITAL_COMMUNITY)
Admission: EM | Admit: 2017-04-22 | Discharge: 2017-04-22 | Disposition: A | Discharge status: Home / Self Care | Payer: Self-pay | Attending: Family Medicine | Admitting: Family Medicine

## 2017-04-22 ENCOUNTER — Encounter (HOSPITAL_COMMUNITY): Payer: Self-pay | Admitting: Emergency Medicine

## 2017-04-22 DIAGNOSIS — R110 Nausea: Secondary | ICD-10-CM

## 2017-04-22 DIAGNOSIS — R51 Headache: Secondary | ICD-10-CM

## 2017-04-22 DIAGNOSIS — W57XXXA Bitten or stung by nonvenomous insect and other nonvenomous arthropods, initial encounter: Secondary | ICD-10-CM

## 2017-04-22 MED ORDER — DOXYCYCLINE HYCLATE 100 MG PO CAPS: 100.0000 mg | ORAL_CAPSULE | Freq: Two times a day (BID) | ORAL | 0 refills | Status: DC
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
## 2017-04-22 NOTE — ED Triage Notes (Signed)
Pt. Stated, I was bitten by something about over a week ago and ever since then, I've had headache, some nausea, and just not feeling good.

## 2017-06-30 ENCOUNTER — Encounter (HOSPITAL_COMMUNITY): Payer: Self-pay | Admitting: *Deleted

## 2017-06-30 ENCOUNTER — Ambulatory Visit (HOSPITAL_COMMUNITY)
Admission: EM | Admit: 2017-06-30 | Discharge: 2017-06-30 | Disposition: A | Discharge status: Home / Self Care | Payer: Self-pay | Attending: Family Medicine | Admitting: Family Medicine

## 2017-06-30 DIAGNOSIS — B372 Candidiasis of skin and nail: Secondary | ICD-10-CM

## 2017-06-30 MED ORDER — FLUCONAZOLE 150 MG PO TABS: 150.0000 mg | ORAL_TABLET | Freq: Every day | ORAL | 0 refills | Status: DC

## 2017-06-30 MED ORDER — TERBINAFINE HCL 1 % EX CREA: 1.0000 "application " | TOPICAL_CREAM | Freq: Two times a day (BID) | CUTANEOUS | 1 refills | Status: DC

## 2017-06-30 NOTE — ED Triage Notes (Signed)
Noticed   A  Rash   And  Lesion  To     l    Inguinal  Area     Symptoms    X   6  Days      Denies   Any penile  Discharge      Pt  Awake  And  Alert  And  Oriented
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
## 2017-07-15 ENCOUNTER — Encounter (HOSPITAL_COMMUNITY): Payer: Self-pay | Admitting: Emergency Medicine

## 2017-07-15 ENCOUNTER — Ambulatory Visit (HOSPITAL_COMMUNITY)
Admission: EM | Admit: 2017-07-15 | Discharge: 2017-07-15 | Disposition: A | Discharge status: Home / Self Care | Payer: Self-pay | Attending: Internal Medicine | Admitting: Internal Medicine

## 2017-07-15 DIAGNOSIS — Z113 Encounter for screening for infections with a predominantly sexual mode of transmission: Secondary | ICD-10-CM | POA: Insufficient documentation

## 2017-07-15 DIAGNOSIS — R369 Urethral discharge, unspecified: Secondary | ICD-10-CM

## 2017-07-15 DIAGNOSIS — Z202 Contact with and (suspected) exposure to infections with a predominantly sexual mode of transmission: Secondary | ICD-10-CM

## 2017-07-15 DIAGNOSIS — R300 Dysuria: Secondary | ICD-10-CM

## 2017-07-15 LAB — POCT URINALYSIS DIP (DEVICE)
Bilirubin Urine: NEGATIVE
Glucose, UA: NEGATIVE mg/dL
KETONES UR: NEGATIVE mg/dL
Leukocytes, UA: NEGATIVE
Nitrite: NEGATIVE
PH: 6 (ref 5.0–8.0)
PROTEIN: NEGATIVE mg/dL
Specific Gravity, Urine: 1.02 (ref 1.005–1.030)
Urobilinogen, UA: 0.2 mg/dL (ref 0.0–1.0)

## 2017-07-15 MED ORDER — CEFTRIAXONE SODIUM 250 MG IJ SOLR
250.0000 mg | Freq: Once | INTRAMUSCULAR | Status: AC
  Administered 2017-07-15: 250 mg via INTRAMUSCULAR

## 2017-07-15 MED ORDER — CEFTRIAXONE SODIUM 250 MG IJ SOLR
INTRAMUSCULAR | Status: AC
  Filled 2017-07-15: qty 250

## 2017-07-15 MED ORDER — CIPROFLOXACIN HCL 500 MG PO TABS: 500.0000 mg | ORAL_TABLET | Freq: Two times a day (BID) | ORAL | 0 refills | Status: DC

## 2017-07-15 MED ORDER — LIDOCAINE HCL (PF) 1 % IJ SOLN
INTRAMUSCULAR | Status: AC
Start: 2017-07-15 — End: 2017-07-15
  Filled 2017-07-15: qty 2

## 2017-07-15 MED ORDER — AZITHROMYCIN 250 MG PO TABS
1000.0000 mg | ORAL_TABLET | Freq: Once | ORAL | Status: AC
  Administered 2017-07-15: 1000 mg via ORAL

## 2017-07-15 MED ORDER — AZITHROMYCIN 250 MG PO TABS
ORAL_TABLET | ORAL | Status: AC
  Filled 2017-07-15: qty 4

## 2017-07-15 NOTE — Discharge Instructions (Addendum)
Blood and urine tests for common infections were done at the urgent care today. Injection of Rocephin, and oral dose of Zithromax, both antibiotics, were given. The urgent care will contact you further treatment is needed. Please recheck or follow-up with your primary care provider if symptoms are not improving.
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
## 2017-07-15 NOTE — ED Triage Notes (Signed)
Pt reports a lesion on his penis and burning with urination for the last four days.

## 2017-07-16 LAB — URINE CULTURE: Culture: NO GROWTH

## 2017-07-16 LAB — HIV ANTIBODY (ROUTINE TESTING W REFLEX): HIV SCREEN 4TH GENERATION: NONREACTIVE

## 2017-07-16 LAB — HSV 1 ANTIBODY, IGG

## 2017-07-17 LAB — RPR: RPR Ser Ql: NONREACTIVE

## 2017-07-17 LAB — URINE CYTOLOGY ANCILLARY ONLY
CHLAMYDIA, DNA PROBE: NEGATIVE
NEISSERIA GONORRHEA: NEGATIVE
Trichomonas: NEGATIVE

## 2017-07-18 LAB — HSV 2 ANTIBODY, IGG: HSV 2 Glycoprotein G Ab, IgG: 2.14 index — ABNORMAL HIGH (ref 0.00–0.90)

## 2017-07-18 LAB — HSV-2 IGG SUPPLEMENTAL TEST: HSV-2 IgG Supplemental Test: POSITIVE — AB

## 2017-07-31 ENCOUNTER — Encounter (HOSPITAL_COMMUNITY): Payer: Self-pay | Admitting: Emergency Medicine

## 2017-07-31 ENCOUNTER — Ambulatory Visit (HOSPITAL_COMMUNITY)
Admission: EM | Admit: 2017-07-31 | Discharge: 2017-07-31 | Disposition: A | Discharge status: Home / Self Care | Payer: Self-pay | Attending: Internal Medicine | Admitting: Internal Medicine

## 2017-07-31 DIAGNOSIS — R300 Dysuria: Secondary | ICD-10-CM

## 2017-07-31 MED ORDER — PENCICLOVIR 1 % EX CREA: 1.0000 "application " | TOPICAL_CREAM | CUTANEOUS | 0 refills | Status: DC

## 2017-07-31 MED ORDER — TAMSULOSIN HCL 0.4 MG PO CAPS: 0.4000 mg | ORAL_CAPSULE | Freq: Every day | ORAL | 0 refills | Status: DC

## 2017-07-31 NOTE — ED Triage Notes (Signed)
Pt here for follow up for lesions to genital area; pt sts hx of same in past

## 2017-07-31 NOTE — Discharge Instructions (Signed)
Penciclovir on lesion as directed. 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 If this improves symptoms, you will need further refills and evaluation by urology. I have attached urology information for you.
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
## 2017-08-01 ENCOUNTER — Telehealth (HOSPITAL_COMMUNITY): Payer: Self-pay | Admitting: Emergency Medicine

## 2017-08-01 NOTE — Telephone Encounter (Signed)
Patient called saying pharmacy quoted a $700.00 price tag on cream ordered.  Spoke to Amy, [place] about expense of drug.  Staff members have spoke to patient about lab results and this particular lesion.  Patient does not seem to understand that this lesion is not herpes related, and antiviral will not help this issue.  Notified patient that here is no substitute for cream.  Patient continues to question if medicine to help with breakouts-again. lesion is not herpes.

## 2018-02-15 ENCOUNTER — Ambulatory Visit (HOSPITAL_COMMUNITY)
Admission: EM | Admit: 2018-02-15 | Discharge: 2018-02-15 | Disposition: A | Discharge status: Home / Self Care | Payer: Self-pay

## 2018-02-15 ENCOUNTER — Encounter (HOSPITAL_COMMUNITY): Payer: Self-pay | Admitting: Emergency Medicine

## 2018-02-15 ENCOUNTER — Other Ambulatory Visit: Payer: Self-pay

## 2018-02-15 ENCOUNTER — Emergency Department (HOSPITAL_COMMUNITY)
Admission: EM | Admit: 2018-02-15 | Discharge: 2018-02-16 | Disposition: A | Discharge status: Home / Self Care | Payer: Self-pay | Attending: Emergency Medicine | Admitting: Emergency Medicine

## 2018-02-15 DIAGNOSIS — Y93E5 Activity, floor mopping and cleaning: Secondary | ICD-10-CM | POA: Insufficient documentation

## 2018-02-15 DIAGNOSIS — T543X1A Toxic effect of corrosive alkalis and alkali-like substances, accidental (unintentional), initial encounter: Secondary | ICD-10-CM | POA: Insufficient documentation

## 2018-02-15 DIAGNOSIS — F17210 Nicotine dependence, cigarettes, uncomplicated: Secondary | ICD-10-CM | POA: Insufficient documentation

## 2018-02-15 DIAGNOSIS — Y92009 Unspecified place in unspecified non-institutional (private) residence as the place of occurrence of the external cause: Secondary | ICD-10-CM | POA: Insufficient documentation

## 2018-02-15 DIAGNOSIS — Z23 Encounter for immunization: Secondary | ICD-10-CM | POA: Insufficient documentation

## 2018-02-15 DIAGNOSIS — Z79899 Other long term (current) drug therapy: Secondary | ICD-10-CM | POA: Insufficient documentation

## 2018-02-15 DIAGNOSIS — H10213 Acute toxic conjunctivitis, bilateral: Secondary | ICD-10-CM | POA: Insufficient documentation

## 2018-02-15 DIAGNOSIS — Y999 Unspecified external cause status: Secondary | ICD-10-CM | POA: Insufficient documentation

## 2018-02-15 DIAGNOSIS — N189 Chronic kidney disease, unspecified: Secondary | ICD-10-CM | POA: Insufficient documentation

## 2018-02-15 DIAGNOSIS — Z77098 Contact with and (suspected) exposure to other hazardous, chiefly nonmedicinal, chemicals: Secondary | ICD-10-CM

## 2018-02-15 MED ORDER — TETANUS-DIPHTH-ACELL PERTUSSIS 5-2.5-18.5 LF-MCG/0.5 IM SUSP
0.5000 mL | Freq: Once | INTRAMUSCULAR | Status: AC
  Administered 2018-02-15: 0.5 mL via INTRAMUSCULAR
  Filled 2018-02-15: qty 0.5

## 2018-02-15 MED ORDER — FLUORESCEIN SODIUM 1 MG OP STRP
ORAL_STRIP | OPHTHALMIC | Status: AC
  Filled 2018-02-15: qty 1

## 2018-02-15 MED ORDER — SULFACETAMIDE SODIUM 10 % OP SOLN
2.0000 [drp] | Freq: Four times a day (QID) | OPHTHALMIC | Status: DC
  Administered 2018-02-15: 2 [drp] via OPHTHALMIC
  Filled 2018-02-15: qty 15

## 2018-02-15 NOTE — ED Notes (Signed)
See provider assessment 

## 2018-02-15 NOTE — ED Notes (Signed)
Pt eye irrigated with lactated ringers
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
## 2018-02-15 NOTE — ED Triage Notes (Signed)
Pt st's he was pressure washing a house today and got bleach in his eyes.  Pt st's he washed his eyes out but they continue to hurt and vision is cloudy

## 2018-02-15 NOTE — ED Notes (Signed)
Called for patient, patient had stepped outside.

## 2018-02-15 NOTE — ED Notes (Signed)
Discussed patient with amy yu, pa.  Patient agreed to go to ed

## 2018-02-15 NOTE — Discharge Instructions (Addendum)
Please wear tight fitting goggles in the future to protect your eyes.    Please use your eyedrops by putting 2 drops in each eye 4 times a day for 7 days.  I have given you follow-up with an eye doctor.  Please call them tomorrow morning and set up an appointment.  It is very important that you follow-up with them for a recheck.  Failure to do so could result in permanent visual damage and loss.    Please take Ibuprofen (Advil, motrin) and Tylenol (acetaminophen) to relieve your pain.  You may take up to 600 MG (3 pills) of normal strength ibuprofen every 8 hours as needed.  In between doses of ibuprofen you make take tylenol, up to 1,000 mg (two extra strength pills).  Do not take more than 3,000 mg tylenol in a 24 hour period.  Please check all medication labels as many medications such as pain and cold medications may contain tylenol.  Do not drink alcohol while taking these medications.  Do not take other NSAID'S while taking ibuprofen (such as aleve or naproxen).  Please take ibuprofen with food to decrease stomach upset.

## 2019-10-15 ENCOUNTER — Encounter (HOSPITAL_COMMUNITY): Payer: Self-pay

## 2019-10-15 ENCOUNTER — Other Ambulatory Visit: Payer: Self-pay

## 2019-10-15 ENCOUNTER — Ambulatory Visit (HOSPITAL_COMMUNITY)
Admission: EM | Admit: 2019-10-15 | Discharge: 2019-10-15 | Disposition: A | Discharge status: Home / Self Care | Payer: Self-pay | Attending: Physician Assistant | Admitting: Physician Assistant

## 2019-10-15 DIAGNOSIS — L739 Follicular disorder, unspecified: Secondary | ICD-10-CM

## 2019-10-15 MED ORDER — DOXYCYCLINE HYCLATE 100 MG PO CAPS: 100.0000 mg | ORAL_CAPSULE | Freq: Two times a day (BID) | ORAL | 0 refills | Status: AC

## 2019-10-15 NOTE — ED Triage Notes (Signed)
Patient presents to Urgent Care with complaints of two abscesses on the left side of his face since a few weeks ago. Patient reports he has had to get them drained in the past.

## 2019-10-15 NOTE — Discharge Instructions (Addendum)
I have prescribed you an antibiotic. Take it as directed. Drink plenty of water with this medication.  Follow up with your Primary Care or a dermatologist if you do not have improvement and for continued management.  Return here if you have acute worsening of symptoms for re-evaluation.   Do not close shave until there has been resolution of your symptoms. Wash your face regularly.
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
## 2020-07-28 ENCOUNTER — Encounter (HOSPITAL_COMMUNITY): Payer: Self-pay

## 2020-07-28 ENCOUNTER — Other Ambulatory Visit: Payer: Self-pay

## 2020-07-28 ENCOUNTER — Ambulatory Visit (HOSPITAL_COMMUNITY)
Admission: EM | Admit: 2020-07-28 | Discharge: 2020-07-28 | Disposition: A | Discharge status: Home / Self Care | Payer: BLUE CROSS/BLUE SHIELD | Attending: Urgent Care | Admitting: Urgent Care

## 2020-07-28 DIAGNOSIS — R319 Hematuria, unspecified: Secondary | ICD-10-CM

## 2020-07-28 DIAGNOSIS — Z87442 Personal history of urinary calculi: Secondary | ICD-10-CM

## 2020-07-28 DIAGNOSIS — L0231 Cutaneous abscess of buttock: Secondary | ICD-10-CM

## 2020-07-28 LAB — POCT URINALYSIS DIPSTICK, ED / UC
Bilirubin Urine: NEGATIVE
Glucose, UA: NEGATIVE mg/dL
Hgb urine dipstick: NEGATIVE
Ketones, ur: NEGATIVE mg/dL
Leukocytes,Ua: NEGATIVE
Nitrite: NEGATIVE
Protein, ur: NEGATIVE mg/dL
Specific Gravity, Urine: >=1.03 (ref 1.005–1.030)
Urobilinogen, UA: 0.2 mg/dL (ref 0.0–1.0)
pH: 5.5 (ref 5.0–8.0)

## 2020-07-28 MED ORDER — TAMSULOSIN HCL 0.4 MG PO CAPS: 0.4000 mg | ORAL_CAPSULE | Freq: Every day | ORAL | 0 refills | Status: AC

## 2020-07-28 MED ORDER — DOXYCYCLINE HYCLATE 100 MG PO CAPS: 100.0000 mg | ORAL_CAPSULE | Freq: Two times a day (BID) | ORAL | 0 refills | Status: DC

## 2020-07-28 NOTE — ED Triage Notes (Addendum)
Pt presents with complaints of right sided flank pain and hematuria x 2 days. History of kidney stones.   Pt also complaints of an abscess on his left buttock. States he has a history of staph infection.

## 2020-07-28 NOTE — Discharge Instructions (Signed)
Please change your dressing 2-3 times daily. Do not apply any ointments or creams. Each time you change your dressing, make sure you clean gently around the perimeter of the wound with gentle soap and warm water. Pat your wound dry and let it air out if possible for 1-2 hours before reapplying another dressing.  °
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
## 2022-08-07 ENCOUNTER — Ambulatory Visit (HOSPITAL_COMMUNITY)
Admission: EM | Admit: 2022-08-07 | Discharge: 2022-08-07 | Disposition: A | Discharge status: Home / Self Care | Payer: BLUE CROSS/BLUE SHIELD | Attending: Physician Assistant | Admitting: Physician Assistant

## 2022-08-07 ENCOUNTER — Encounter (HOSPITAL_COMMUNITY): Payer: Self-pay

## 2022-08-07 DIAGNOSIS — R220 Localized swelling, mass and lump, head: Secondary | ICD-10-CM

## 2022-08-07 DIAGNOSIS — K047 Periapical abscess without sinus: Secondary | ICD-10-CM

## 2022-08-07 MED ORDER — CLINDAMYCIN HCL 300 MG PO CAPS: 300.0000 mg | ORAL_CAPSULE | Freq: Three times a day (TID) | ORAL | 0 refills | Status: AC
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
## 2022-08-07 NOTE — ED Triage Notes (Signed)
Pt reports dental pain and swelling for several days.

## 2022-08-07 NOTE — Discharge Instructions (Signed)
I am very concerned that you are unable to open your mouth completely.  I do think you should go to the emergency room.  Since you do not want to do that today, I recommend starting antibiotics.  If the swelling does not improve and you are still unable to completely open your mouth within 24 hours of being on the medication you need to go to the ER.  Please call and schedule an appointment with dentist soon as possible.  Use over-the-counter medication for symptom management.  If anything worsens go to the ER.
